# Patient Record
Sex: Male | Born: 1965 | Race: Black or African American | Hispanic: No | Smoking: Current every day smoker
Health system: Southern US, Community
[De-identification: ages and names within clinical notes are randomized; demographics above are authoritative.]

## PROBLEM LIST (undated history)

## (undated) DIAGNOSIS — R42 Dizziness and giddiness: Secondary | ICD-10-CM

---

## 2020-03-12 ENCOUNTER — Other Ambulatory Visit: Payer: Self-pay

## 2020-03-12 ENCOUNTER — Emergency Department (HOSPITAL_COMMUNITY)
Admission: EM | Admit: 2020-03-12 | Discharge: 2020-03-12 | Disposition: A | Discharge status: Home / Self Care | Payer: BC Managed Care – PPO | Attending: Emergency Medicine | Admitting: Emergency Medicine

## 2020-03-12 ENCOUNTER — Emergency Department (HOSPITAL_COMMUNITY): Payer: BC Managed Care – PPO

## 2020-03-12 DIAGNOSIS — R000 Tachycardia, unspecified: Secondary | ICD-10-CM | POA: Diagnosis not present

## 2020-03-12 DIAGNOSIS — R519 Headache, unspecified: Secondary | ICD-10-CM | POA: Diagnosis present

## 2020-03-12 DIAGNOSIS — R0981 Nasal congestion: Secondary | ICD-10-CM

## 2020-03-12 DIAGNOSIS — U071 COVID-19: Secondary | ICD-10-CM | POA: Insufficient documentation

## 2020-03-12 DIAGNOSIS — R197 Diarrhea, unspecified: Secondary | ICD-10-CM | POA: Insufficient documentation

## 2020-03-12 DIAGNOSIS — R109 Unspecified abdominal pain: Secondary | ICD-10-CM | POA: Diagnosis not present

## 2020-03-12 DIAGNOSIS — J3489 Other specified disorders of nose and nasal sinuses: Secondary | ICD-10-CM

## 2020-03-12 DIAGNOSIS — R059 Cough, unspecified: Secondary | ICD-10-CM

## 2020-03-12 LAB — SARS CORONAVIRUS 2 (TAT 6-24 HRS): SARS Coronavirus 2: POSITIVE — AB

## 2020-03-12 MED ORDER — DOXYCYCLINE HYCLATE 100 MG PO CAPS: 100.0000 mg | ORAL_CAPSULE | Freq: Two times a day (BID) | ORAL | 0 refills | Status: DC

## 2020-03-12 NOTE — Discharge Instructions (Signed)
You have been seen here for URI like symptoms.  I recommend taking Tylenol for fever control and ibuprofen for pain control please follow dosing on the back of bottle.  I have started you on antibiotics please take as prescribed.  I recommend staying hydrated and if you do not an appetite, I recommend soups as this will provide you with fluids and calories.  Your Covid test is pending I recommend self quarantine until you get your results back on MyChart.    if you are Covid negative and continue to have symptoms after 1 to 2 weeks you may follow-up with your primary care provider  Come back to the emergency department if you develop chest pain, shortness of breath, severe abdominal pain, uncontrolled nausea, vomiting, diarrhea.

## 2020-03-12 NOTE — ED Triage Notes (Signed)
Starting two weeks ago pt said he thought he had a cold. Tuesday morning he woke up with an itchy throat, he was chilled all day at work, had a headache, chest tightness, runny nose, and fatigue. COVID-19 Moderna vaccinated around 6-7 months ago.

## 2020-03-12 NOTE — ED Provider Notes (Signed)
[facility]-EMERGENCY DEPT Provider Note   CSN: [id number] Arrival date & time: 03/12/20  1042     History Chief Complaint  Patient presents with  . Fatigue    [facility] is a 55 y.o. male.  HPI   Patient with no significant medical history presents to the emergency department with chief complaint of URI-like symptoms.  Patient endorses his symptoms originally started 2 weeks ago but 3 days ago symptoms gotten worse.  He state he has headaches, increased nasal congestion, sinus pressures, scratchy throat, productive cough with rust colored sputum, shortness of breath due to congestion, and some nausea with since resolved.  He endorses that he is vaccine against COVID-19, has not gotten his booster shot, has not gotten the flu shot.  He denies recent sick contacts.  He is not immunocompromise.  He endorses he feels slightly short of breath on exertion but this is due to the nasal congestion and chest tightness from the chest cold.  He has been taking over-the-counter pain medication witrhout relief, is tolerating p.o. He has no history of DVTs or PEs, is not on hormone therapy.  Patient denies alleviating factors.  Patient has chest pain, abdominal pain, nausea, vomiting, diarrhea, general malaise, pedal edema.  No past medical history on file.  There are no problems to display for this patient.        No family history on file.     Home Medications Prior to Admission medications   Medication Sig Start Date End Date Taking? Authorizing Provider  doxycycline (VIBRAMYCIN) 100 MG capsule Take 1 capsule (100 mg total) by mouth 2 (two) times daily. 03/12/20  Yes Carroll Sage, PA-C    Allergies    Patient has no allergy information on record.  Review of Systems   Review of Systems  Constitutional: Negative for chills and fever.  HENT: Positive for congestion and postnasal drip. Negative for sore throat.   Respiratory: Positive for cough and chest  tightness. Negative for shortness of breath.   Cardiovascular: Negative for chest pain.  Gastrointestinal: Negative for abdominal pain, diarrhea, nausea and vomiting.  Genitourinary: Negative for enuresis.  Musculoskeletal: Negative for back pain and myalgias.  Skin: Negative for rash.  Neurological: Positive for headaches. Negative for dizziness.  Hematological: Does not bruise/bleed easily.    Physical Exam Updated Vital Signs BP 115/76   Pulse 80   Temp 98.6 F (37 C) (Oral)   Resp 16   Ht 6\' 7"  (2.007 m)   Wt 83 kg   SpO2 95%   BMI 20.62 kg/m   Physical Exam Vitals and nursing note reviewed.  Constitutional:      General: He is not in acute distress.    Appearance: He is not ill-appearing.  HENT:     Head: Normocephalic and atraumatic.     Right Ear: Tympanic membrane, ear canal and external ear normal.     Left Ear: Tympanic membrane, ear canal and external ear normal.     Nose: No congestion.     Comments: Patient has erythematous turbinates    Mouth/Throat:     Mouth: Mucous membranes are moist.     Pharynx: Oropharynx is clear. No oropharyngeal exudate or posterior oropharyngeal erythema.  Eyes:     Conjunctiva/sclera: Conjunctivae normal.  Cardiovascular:     Rate and Rhythm: Regular rhythm. Tachycardia present.     Pulses: Normal pulses.     Heart sounds: No murmur heard. No friction rub. No gallop.  Pulmonary:     Effort: No respiratory distress.     Breath sounds: Rhonchi and rales present. No wheezing.     Comments: Patient has noted left bibasilar Rales with occasional rhonchi in the mid lung, no signs of respiratory stress noted. Abdominal:     Palpations: Abdomen is soft.     Tenderness: There is no abdominal tenderness.  Musculoskeletal:     Right lower leg: No edema.     Left lower leg: No edema.     Comments: Patient is moving all 4 extremities at difficulty.  Skin:    General: Skin is warm and dry.  Neurological:     Mental Status: He is  alert.  Psychiatric:        Mood and Affect: Mood normal.     ED Results / Procedures / Treatments   Labs (all labs ordered are listed, but only abnormal results are displayed) Labs Reviewed  SARS CORONAVIRUS 2 (TAT 6-24 HRS)    EKG None  Radiology DG Chest Port 1 View  Result Date: 03/12/2020 CLINICAL DATA:  Cough. EXAM: PORTABLE CHEST 1 VIEW COMPARISON:  03/12/2020. FINDINGS: Mediastinum and hilar structures normal. Lungs are clear. No focal pulmonary infiltrate. Tiny bilateral pleural effusions cannot be excluded. No pneumothorax. IMPRESSION: Lungs are clear of infiltrates. Tiny bilateral pleural effusions cannot be excluded. Electronically Signed   By: Maisie Fus  Register   On: 03/12/2020 12:57    Procedures Procedures   Medications Ordered in ED Medications - No data to display  ED Course  I have reviewed the triage vital signs and the nursing notes.  Pertinent labs & imaging results that were available during my care of the patient were reviewed by me and considered in my medical decision making (see chart for details).    MDM Rules/Calculators/A&P                          Initial impression-patient presents with URI-like symptoms.  He is alert, does not appear acute distress, vital signs reassuring.  Concern for pneumonia, sinus infection, systemic infection.  Will obtain chest x-ray and COVID testing for further evaluation.  Work-up-Covid test is pending at this time.  Chest x-ray does not reveal any acute findings.  Rule out- Low suspicion for systemic infection as patient is nontoxic-appearing, vital signs reassuring, no obvious source infection noted on exam.  Low suspicion for pneumonia as lung sounds are clear bilaterally, x-ray did not reveal any acute findings.  I have low suspicion for PE as patient denies pleuritic chest pain, shortness of breath, vital signs reassuring, patient has low risk factors, history more consistent with a viral URI.Marland Kitchen low suspicion for  strep throat as oropharynx was visualized, no erythema or exudates noted.  Low suspicion patient would need  hospitalized due to viral infection or Covid as vital signs reassuring, patient is not in respiratory distress.  Patient does not meet inclusion criteria for infusion as symptoms have been going on for last 3 weeks.   Plan-suspect patient suffering from a viral URI but it is atypical for patient to have continuing symptoms for last 3 weeks, concerning for possible sinusitis/atypical pneumonia.  Will start him on doxy for further treatment.  Have him follow with PCP for further evaluation.  Vital signs have remained stable, no indication for hospital admission.  Patient given at home care as well strict return precautions.  Patient verbalized that they understood agreed to said plan.    Final  Clinical Impression(s) / ED Diagnoses Final diagnoses:  Cough  Nasal congestion  Sinus pressure    Rx / DC Orders ED Discharge Orders         Ordered    doxycycline (VIBRAMYCIN) 100 MG capsule  2 times daily        03/12/20 1318           Barnie Del 03/12/20 1323    Maia Plan, MD 03/13/20 [phone]

## 2020-03-13 ENCOUNTER — Telehealth: Payer: Self-pay

## 2020-03-13 NOTE — Telephone Encounter (Signed)
Patient given positive result and given positive protocols. Patient verbalized understanding

## 2020-06-11 ENCOUNTER — Encounter (HOSPITAL_COMMUNITY): Payer: Self-pay

## 2020-06-11 ENCOUNTER — Emergency Department (HOSPITAL_COMMUNITY): Payer: PRIVATE HEALTH INSURANCE

## 2020-06-11 ENCOUNTER — Other Ambulatory Visit: Payer: Self-pay

## 2020-06-11 ENCOUNTER — Emergency Department (HOSPITAL_COMMUNITY)
Admission: EM | Admit: 2020-06-11 | Discharge: 2020-06-11 | Disposition: A | Discharge status: Home / Self Care | Payer: PRIVATE HEALTH INSURANCE | Attending: Emergency Medicine | Admitting: Emergency Medicine

## 2020-06-11 DIAGNOSIS — Y990 Civilian activity done for income or pay: Secondary | ICD-10-CM | POA: Diagnosis not present

## 2020-06-11 DIAGNOSIS — F17210 Nicotine dependence, cigarettes, uncomplicated: Secondary | ICD-10-CM | POA: Diagnosis not present

## 2020-06-11 DIAGNOSIS — X509XXA Other and unspecified overexertion or strenuous movements or postures, initial encounter: Secondary | ICD-10-CM | POA: Insufficient documentation

## 2020-06-11 DIAGNOSIS — Y9269 Other specified industrial and construction area as the place of occurrence of the external cause: Secondary | ICD-10-CM | POA: Insufficient documentation

## 2020-06-11 DIAGNOSIS — M5417 Radiculopathy, lumbosacral region: Secondary | ICD-10-CM

## 2020-06-11 DIAGNOSIS — M545 Low back pain, unspecified: Secondary | ICD-10-CM | POA: Diagnosis present

## 2020-06-11 MED ORDER — TRAMADOL HCL 50 MG PO TABS: 50.0000 mg | ORAL_TABLET | Freq: Four times a day (QID) | ORAL | 0 refills | Status: DC | PRN

## 2020-06-11 MED ORDER — HYDROCODONE-ACETAMINOPHEN 5-325 MG PO TABS
2.0000 | ORAL_TABLET | Freq: Once | ORAL | Status: DC
  Filled 2020-06-11: qty 2

## 2020-06-11 MED ORDER — CYCLOBENZAPRINE HCL 10 MG PO TABS
5.0000 mg | ORAL_TABLET | Freq: Once | ORAL | Status: AC
  Administered 2020-06-11: 5 mg via ORAL
  Filled 2020-06-11: qty 1

## 2020-06-11 MED ORDER — CYCLOBENZAPRINE HCL 10 MG PO TABS: 5.0000 mg | ORAL_TABLET | Freq: Three times a day (TID) | ORAL | 0 refills | Status: DC | PRN

## 2020-06-11 MED ORDER — TRAMADOL HCL 50 MG PO TABS: 50.0000 mg | ORAL_TABLET | Freq: Once | ORAL | Status: DC

## 2020-06-11 MED ORDER — PREDNISONE 20 MG PO TABS: ORAL_TABLET | ORAL | 0 refills | Status: DC

## 2020-06-11 MED ORDER — PREDNISONE 20 MG PO TABS
60.0000 mg | ORAL_TABLET | Freq: Once | ORAL | Status: AC
  Administered 2020-06-11: 60 mg via ORAL
  Filled 2020-06-11: qty 3

## 2020-06-11 MED ORDER — HYDROCODONE-ACETAMINOPHEN 5-325 MG PO TABS
2.0000 | ORAL_TABLET | Freq: Once | ORAL | Status: AC
  Administered 2020-06-11: 2 via ORAL

## 2020-06-11 NOTE — Discharge Instructions (Signed)
You likely have a pinched nerve in your back from a slipped disc  Please rest for 2 days  Take Motrin 800 mg every 6 hours for pain  Please take prednisone as prescribed  Take tramadol for severe pain and Flexeril for muscle spasm  See neurosurgery for follow-up  Return to ER if you have worse back pain, numbness, weakness, trouble walking, unable to urinate

## 2020-06-11 NOTE — ED Triage Notes (Signed)
Patient c/o left lower back pain 2 days ago. Patient states he does heavy lifting at his job. Patient also reports that the pain radiates into the left leg.

## 2020-06-11 NOTE — ED Provider Notes (Signed)
[facility] Provider Note   CSN: [id number] Arrival date & time: 06/11/20  1218     History Chief Complaint  Patient presents with  . Back Pain    [facility] is a 55 y.o. male hx of here presenting with back pain.  Patient states that he works for [employer].  He states that he lifts heavy items all the time.  He was lifting something heavy 2 days ago and felt a pop in his lower back.  He then had a pain radiating down his left leg.  He has been taking ibuprofen with minimal relief.  Denies any numbness to the leg or trouble urinating.  He states that he was in too much pain so he was not able to work  The history is provided by the patient.       History reviewed. No pertinent past medical history.  There are no problems to display for this patient.   History reviewed. No pertinent surgical history.     Family History  Problem Relation Age of Onset  . Hypertension Mother     Social History   Tobacco Use  . Smoking status: Current Every Day Smoker    Packs/day: 0.25    Types: Cigarettes  . Smokeless tobacco: Never Used  Vaping Use  . Vaping Use: Never used  Substance Use Topics  . Alcohol use: Never  . Drug use: Never    Home Medications Prior to Admission medications   Medication Sig Start Date End Date Taking? Authorizing Provider  doxycycline (VIBRAMYCIN) 100 MG capsule Take 1 capsule (100 mg total) by mouth 2 (two) times daily. 03/12/20   Carroll Sage, PA-C    Allergies    Patient has no known allergies.  Review of Systems   Review of Systems  Musculoskeletal: Positive for back pain.  All other systems reviewed and are negative.   Physical Exam Updated Vital Signs BP 123/82 (BP Location: Left Arm)   Pulse 93   Temp 98 F (36.7 C) (Oral)   Resp 16   Ht 6\' 7"  (2.007 m)   Wt 86.2 kg   SpO2 100%   BMI 21.40 kg/m   Physical Exam Vitals and nursing note reviewed.  Constitutional:       Appearance: Normal appearance.     Comments: Uncomfortable  HENT:     Head: Normocephalic.     Nose: Nose normal.     Mouth/Throat:     Mouth: Mucous membranes are moist.  Eyes:     Extraocular Movements: Extraocular movements intact.     Pupils: Pupils are equal, round, and reactive to light.  Cardiovascular:     Rate and Rhythm: Normal rate and regular rhythm.     Pulses: Normal pulses.     Heart sounds: Normal heart sounds.  Pulmonary:     Effort: Pulmonary effort is normal.     Breath sounds: Normal breath sounds.  Abdominal:     General: Abdomen is flat.     Palpations: Abdomen is soft.  Musculoskeletal:     Cervical back: Normal range of motion and neck supple.     Comments: Left paralumbar tenderness.  No obvious deformity or step-off  Skin:    General: Skin is warm.     Capillary Refill: Capillary refill takes less than 2 seconds.  Neurological:     General: No focal deficit present.     Mental Status: He is alert and oriented to person, place,  and time.  Psychiatric:        Mood and Affect: Mood normal.        Behavior: Behavior normal.     ED Results / Procedures / Treatments   Labs (all labs ordered are listed, but only abnormal results are displayed) Labs Reviewed - No data to display  EKG None  Radiology No results found.  Procedures Procedures   Medications Ordered in ED Medications  cyclobenzaprine (FLEXERIL) tablet 5 mg (has no administration in time range)  predniSONE (DELTASONE) tablet 60 mg (has no administration in time range)  traMADol (ULTRAM) tablet 50 mg (has no administration in time range)    ED Course  I have reviewed the triage vital signs and the nursing notes.  Pertinent labs & imaging results that were available during my care of the patient were reviewed by me and considered in my medical decision making (see chart for details).    MDM Rules/Calculators/A&P                         [facility] is a 55 y.o. male who  presented with back pain.  Likely lumbar radiculopathy from a slip disc.  We will get CT lumbar spine. Patient recovering from narcotics. Will give tramadol and flexeril and steroids.   3:49 PM CT showed multilevel degenerative changes.  There is some disc bulging L3-4 likely causing radiculopathy.  Pain improved and patient is ambulatory.  Will discharge patient home with tramadol and Flexeril and course of steroids and neurosurgery referral.   Final Clinical Impression(s) / ED Diagnoses Final diagnoses:  None    Rx / DC Orders ED Discharge Orders    None       Charlynne Pander, MD 06/11/20 1550

## 2020-06-16 ENCOUNTER — Other Ambulatory Visit: Payer: Self-pay

## 2020-06-16 ENCOUNTER — Emergency Department (HOSPITAL_COMMUNITY)
Admission: EM | Admit: 2020-06-16 | Discharge: 2020-06-16 | Disposition: A | Discharge status: Home / Self Care | Payer: PRIVATE HEALTH INSURANCE | Attending: Emergency Medicine | Admitting: Emergency Medicine

## 2020-06-16 ENCOUNTER — Encounter (HOSPITAL_COMMUNITY): Payer: Self-pay | Admitting: Emergency Medicine

## 2020-06-16 DIAGNOSIS — I10 Essential (primary) hypertension: Secondary | ICD-10-CM | POA: Insufficient documentation

## 2020-06-16 DIAGNOSIS — M5442 Lumbago with sciatica, left side: Secondary | ICD-10-CM | POA: Diagnosis not present

## 2020-06-16 DIAGNOSIS — F17210 Nicotine dependence, cigarettes, uncomplicated: Secondary | ICD-10-CM | POA: Diagnosis not present

## 2020-06-16 DIAGNOSIS — M545 Low back pain, unspecified: Secondary | ICD-10-CM | POA: Diagnosis present

## 2020-06-16 MED ORDER — LIDOCAINE 5 % EX PTCH
1.0000 | MEDICATED_PATCH | CUTANEOUS | Status: DC
  Administered 2020-06-16: 1 via TRANSDERMAL
  Filled 2020-06-16: qty 1

## 2020-06-16 MED ORDER — NAPROXEN 250 MG PO TABS: 500.0000 mg | ORAL_TABLET | Freq: Two times a day (BID) | ORAL | 0 refills | Status: DC

## 2020-06-16 MED ORDER — KETOROLAC TROMETHAMINE 15 MG/ML IJ SOLN
15.0000 mg | Freq: Once | INTRAMUSCULAR | Status: AC
  Administered 2020-06-16: 15 mg via INTRAMUSCULAR
  Filled 2020-06-16: qty 1

## 2020-06-16 MED ORDER — LIDOCAINE 5 % EX PTCH: 1.0000 | MEDICATED_PATCH | CUTANEOUS | 0 refills | Status: DC

## 2020-06-16 MED ORDER — METHOCARBAMOL 500 MG PO TABS: 500.0000 mg | ORAL_TABLET | Freq: Two times a day (BID) | ORAL | 0 refills | Status: DC

## 2020-06-16 NOTE — ED Provider Notes (Signed)
[facility]   CSN: [id number] Arrival date & time: 06/16/20  1215     History Chief Complaint  Patient presents with  . Back Pain    [facility] is a 55 y.o. male patient presented for evaluation of back pain.    Patient states he is having continued back pain, which has been present since he was seen last week.  Pain is not worsened, but is also not excruciating.  He does not feel he can go to work, which is partly what prompted his ER visit.  He was unable to follow-up with neurosurgery as he was confused by the instructions.  He has been taking the medication he was discharged with including prednisone, muscle relaxer, and tramadol without significant improvement symptoms.  He has not tried anything else.  He denies any fall, trauma, or injury.  He denies fevers, chills, nausea, vomiting, loss of bowel bladder control, new numbness or weakness.  Pain radiates to his left leg with certain positions.  Nothing makes his pain better.  Additional history obtained from chart review.  Reviewed recent ER visit which included a CT scan which showed disc degeneration but no acute neurologic emergency  HPI     History reviewed. No pertinent past medical history.  There are no problems to display for this patient.   History reviewed. No pertinent surgical history.     Family History  Problem Relation Age of Onset  . Hypertension Mother     Social History   Tobacco Use  . Smoking status: Current Every Day Smoker    Packs/day: 0.25    Types: Cigarettes  . Smokeless tobacco: Never Used  Vaping Use  . Vaping Use: Never used  Substance Use Topics  . Alcohol use: Never  . Drug use: Never    Home Medications Prior to Admission medications   Medication Sig Start Date End Date Taking? Authorizing Provider  lidocaine (LIDODERM) 5 % Place 1 patch onto the skin daily. Remove & Discard patch within 12 hours or as directed by MD  06/16/20  Yes Laurel Smeltz, PA-C  methocarbamol (ROBAXIN) 500 MG tablet Take 1 tablet (500 mg total) by mouth 2 (two) times daily. 06/16/20  Yes Cedar Ditullio, PA-C  naproxen (NAPROSYN) 250 MG tablet Take 2 tablets (500 mg total) by mouth 2 (two) times daily with a meal. 06/16/20  Yes Khizar Fiorella, PA-C  cyclobenzaprine (FLEXERIL) 10 MG tablet Take 0.5 tablets (5 mg total) by mouth 3 (three) times daily as needed for muscle spasms. 06/11/20   Charlynne Pander, MD  doxycycline (VIBRAMYCIN) 100 MG capsule Take 1 capsule (100 mg total) by mouth 2 (two) times daily. 03/12/20   Carroll Sage, PA-C  predniSONE (DELTASONE) 20 MG tablet Take 60 mg daily x 2 days then 40 mg daily x 2 days then 20 mg daily x 2 days 06/11/20   Charlynne Pander, MD  traMADol (ULTRAM) 50 MG tablet Take 1 tablet (50 mg total) by mouth every 6 (six) hours as needed. 06/11/20   Charlynne Pander, MD    Allergies    Patient has no known allergies.  Review of Systems   Review of Systems  Musculoskeletal: Positive for back pain.  All other systems reviewed and are negative.   Physical Exam Updated Vital Signs BP 118/82   Pulse 89   Temp 98.3 F (36.8 C) (Oral)   Resp 18   SpO2 100%   Physical Exam Vitals  and nursing note reviewed.  Constitutional:      General: He is not in acute distress.    Appearance: He is well-developed.     Comments: Resting in the chair no acute distress  HENT:     Head: Normocephalic and atraumatic.  Eyes:     Conjunctiva/sclera: Conjunctivae normal.     Pupils: Pupils are equal, round, and reactive to light.  Cardiovascular:     Rate and Rhythm: Normal rate and regular rhythm.     Pulses: Normal pulses.  Pulmonary:     Effort: Pulmonary effort is normal. No respiratory distress.     Breath sounds: Normal breath sounds. No wheezing.  Abdominal:     General: There is no distension.     Palpations: Abdomen is soft. There is no mass.     Tenderness: There is no  abdominal tenderness. There is no guarding or rebound.  Musculoskeletal:        General: Tenderness present. Normal range of motion.     Cervical back: Normal range of motion and neck supple.     Comments: Tenderness palpation of bilateral low back musculature, worse on the left side.  No erythema or warmth.  No saddle anesthesia.  Pedal pulses 2+ bilaterally.  Good distal sensation.  Skin:    General: Skin is warm and dry.     Capillary Refill: Capillary refill takes less than 2 seconds.  Neurological:     Mental Status: He is alert and oriented to person, place, and time.     ED Results / Procedures / Treatments   Labs (all labs ordered are listed, but only abnormal results are displayed) Labs Reviewed - No data to display  EKG None  Radiology No results found.  Procedures Procedures   Medications Ordered in ED Medications  lidocaine (LIDODERM) 5 % 1 patch (1 patch Transdermal Patch Applied 06/16/20 1414)  ketorolac (TORADOL) 15 MG/ML injection 15 mg (15 mg Intramuscular Given 06/16/20 1414)    ED Course  I have reviewed the triage vital signs and the nursing notes.  Pertinent labs & imaging results that were available during my care of the patient were reviewed by me and considered in my medical decision making (see chart for details).    MDM Rules/Calculators/A&P                          Patient presenting for evaluation of low back pain.  Physical exam reassuring, neurovascularly intact.  No red flags for back pain.  Pain is reproducible with palpation of the musculature.  Likely musculoskeletal vs sciatic.  Doubt fracture, I do not believe x-rays will be beneficial.  As patient had an overall reassuring CT last week, I do not believe repeat would be helpful.  doubt vertebral injury, infection, spinal cord compression, myelopathy, or cauda equina syndrome.  Will treat symptomatically with NSAIDs, muscle relaxers, muscle creams.  Patient given information to follow-up with  neurosurgery.  At this time, patient appears safe for discharge.  Return precautions given.  Patient states he understands and agrees to plan.  Final Clinical Impression(s) / ED Diagnoses Final diagnoses:  Acute bilateral low back pain with left-sided sciatica    Rx / DC Orders ED Discharge Orders         Ordered    naproxen (NAPROSYN) 250 MG tablet  2 times daily with meals        06/16/20 1617    methocarbamol (ROBAXIN) 500 MG  tablet  2 times daily        06/16/20 1617    lidocaine (LIDODERM) 5 %  Every 24 hours        06/16/20 1617           Matisha Termine, PA-C 06/16/20 1900    Arby Barrette, MD 06/26/20 [phone]

## 2020-06-16 NOTE — Discharge Instructions (Addendum)
It is important that you call the office listed below to set up a follow-up appointment for further evaluation of your back. Take naproxen 2 times a day with meals.  Do not take other anti-inflammatories at the same time (Advil, Motrin, ibuprofen, Aleve). You may supplement with Tylenol if you need further pain control. Use Robaxin as needed for muscle stiffness or soreness. Have caution, as this may make you tired or groggy. Do not drive or operate heavy machinery while taking this medication.  Use lidoderm patch as needed for pain.  Do the back stretches listed in the paperwork to help with your pain. Follow up with the neurosurgeon for further management of your back. Return to the ER if you develop high fevers, numbness, loss of bowel or bladder control, or any new or concerning symptoms.

## 2020-06-16 NOTE — ED Triage Notes (Signed)
Pt still having lower back pains that was seen for here last week when lifted heavy box. Tried going back to work yesterday but unable to do job duties.

## 2020-07-14 ENCOUNTER — Encounter (HOSPITAL_COMMUNITY): Payer: Self-pay | Admitting: *Deleted

## 2020-07-14 ENCOUNTER — Emergency Department (HOSPITAL_COMMUNITY)
Admission: EM | Admit: 2020-07-14 | Discharge: 2020-07-14 | Disposition: A | Discharge status: Home / Self Care | Payer: BC Managed Care – PPO | Attending: Emergency Medicine | Admitting: Emergency Medicine

## 2020-07-14 ENCOUNTER — Other Ambulatory Visit: Payer: Self-pay

## 2020-07-14 ENCOUNTER — Emergency Department (HOSPITAL_COMMUNITY): Payer: BC Managed Care – PPO

## 2020-07-14 DIAGNOSIS — J069 Acute upper respiratory infection, unspecified: Secondary | ICD-10-CM | POA: Diagnosis not present

## 2020-07-14 DIAGNOSIS — F17210 Nicotine dependence, cigarettes, uncomplicated: Secondary | ICD-10-CM | POA: Diagnosis not present

## 2020-07-14 DIAGNOSIS — Z20822 Contact with and (suspected) exposure to covid-19: Secondary | ICD-10-CM | POA: Insufficient documentation

## 2020-07-14 DIAGNOSIS — R059 Cough, unspecified: Secondary | ICD-10-CM | POA: Diagnosis present

## 2020-07-14 DIAGNOSIS — R519 Headache, unspecified: Secondary | ICD-10-CM

## 2020-07-14 LAB — RESP PANEL BY RT-PCR (FLU A&B, COVID) ARPGX2
Influenza A by PCR: NEGATIVE
Influenza B by PCR: NEGATIVE
SARS Coronavirus 2 by RT PCR: NEGATIVE

## 2020-07-14 MED ORDER — AZITHROMYCIN 250 MG PO TABS: 250.0000 mg | ORAL_TABLET | Freq: Every day | ORAL | 0 refills | Status: DC

## 2020-07-14 NOTE — ED Provider Notes (Signed)
[facility]-EMERGENCY DEPT Provider Note   CSN: [id number] Arrival date & time: 07/14/20  1241     History Chief Complaint  Patient presents with  . Nasal Congestion  . Headache    Kevin Knapp is a 55 y.o. male with no reported medical history.  Patient presents with a chief complaint of cough, nasal congestion, rhinorrhea, and headache.  Patient reports that his nasal congestion and rhinorrhea started on Friday 6/3.  He reports that his cough and headache started on Sunday.  He reports that his cough is nonproductive.  Nasal congestion is described as yellow and thick.  Patient reports that his headaches have been intermittent.  Patient has pain to the entire left side of his head.  Patient rates pain 7/10 on the pain scale.  Patient reports that headache onset is gradual and gradually worsens over time.  Patient reports that headache is worse when watching TV or driving.  Patient reports improvement in symptoms when taking naproxen.  Reports that headache pain is similar to previous headaches he has had with a sinus infection.  Endorses chills and sinus pressure.  Patient reports that several coworkers of recently tested positive for COVID-19.  Patient reports that he was vaccinated for COVID-19 and however has not received booster or influenza vaccination.  Patient denies any fevers, sore throat, chest pain, shortness of breath, weakness, numbness, facial asymmetry, slurred speech, syncope.  Has any recent falls or injuries.  HPI     History reviewed. No pertinent past medical history.  There are no problems to display for this patient.   History reviewed. No pertinent surgical history.     Family History  Problem Relation Age of Onset  . Hypertension Mother     Social History   Tobacco Use  . Smoking status: Current Every Day Smoker    Packs/day: 0.25    Types: Cigarettes  . Smokeless tobacco: Never Used  Vaping Use  . Vaping Use: Never used   Substance Use Topics  . Alcohol use: Never  . Drug use: Never    Home Medications Prior to Admission medications   Medication Sig Start Date End Date Taking? Authorizing Provider  cyclobenzaprine (FLEXERIL) 10 MG tablet Take 0.5 tablets (5 mg total) by mouth 3 (three) times daily as needed for muscle spasms. 06/11/20   Charlynne Pander, MD  doxycycline (VIBRAMYCIN) 100 MG capsule Take 1 capsule (100 mg total) by mouth 2 (two) times daily. 03/12/20   Carroll Sage, PA-C  lidocaine (LIDODERM) 5 % Place 1 patch onto the skin daily. Remove & Discard patch within 12 hours or as directed by MD 06/16/20   Caccavale, Sophia, PA-C  methocarbamol (ROBAXIN) 500 MG tablet Take 1 tablet (500 mg total) by mouth 2 (two) times daily. 06/16/20   Caccavale, Sophia, PA-C  naproxen (NAPROSYN) 250 MG tablet Take 2 tablets (500 mg total) by mouth 2 (two) times daily with a meal. 06/16/20   Caccavale, Sophia, PA-C  predniSONE (DELTASONE) 20 MG tablet Take 60 mg daily x 2 days then 40 mg daily x 2 days then 20 mg daily x 2 days 06/11/20   Charlynne Pander, MD  traMADol (ULTRAM) 50 MG tablet Take 1 tablet (50 mg total) by mouth every 6 (six) hours as needed. 06/11/20   Charlynne Pander, MD    Allergies    Patient has no known allergies.  Review of Systems   Review of Systems  Constitutional: Positive for chills. Negative for  fever.  HENT: Positive for congestion, rhinorrhea, sinus pressure and sneezing. Negative for facial swelling and sore throat.   Eyes: Negative for visual disturbance.  Respiratory: Positive for cough. Negative for shortness of breath.   Cardiovascular: Negative for chest pain.  Gastrointestinal: Negative for abdominal pain, diarrhea, nausea and vomiting.  Genitourinary: Negative for difficulty urinating.  Musculoskeletal: Negative for back pain, neck pain and neck stiffness.  Skin: Negative for color change and rash.  Neurological: Positive for headaches. Negative for dizziness,  tremors, seizures, syncope, facial asymmetry, speech difficulty, weakness, light-headedness and numbness.  Psychiatric/Behavioral: Negative for confusion.    Physical Exam Updated Vital Signs BP 122/82   Pulse 66   Temp 98.4 F (36.9 C) (Oral)   Resp 16   Ht 6\' 7"  (2.007 m)   Wt 83.9 kg   SpO2 100%   BMI 20.84 kg/m   Physical Exam Vitals and nursing note reviewed.  Constitutional:      General: He is not in acute distress.    Appearance: He is not ill-appearing, toxic-appearing or diaphoretic.  HENT:     Nose:     Right Sinus: No maxillary sinus tenderness or frontal sinus tenderness.     Left Sinus: No maxillary sinus tenderness or frontal sinus tenderness.     Mouth/Throat:     Mouth: Mucous membranes are moist.     Pharynx: Oropharynx is clear. Uvula midline. No pharyngeal swelling, oropharyngeal exudate, posterior oropharyngeal erythema or uvula swelling.     Tonsils: No tonsillar exudate or tonsillar abscesses. 1+ on the right. 1+ on the left.  Eyes:     General: No scleral icterus.       Right eye: No discharge.        Left eye: No discharge.     Extraocular Movements: Extraocular movements intact.     Pupils: Pupils are equal, round, and reactive to light.  Cardiovascular:     Rate and Rhythm: Normal rate.  Pulmonary:     Effort: Pulmonary effort is normal. No tachypnea, bradypnea or respiratory distress.     Breath sounds: Normal breath sounds. No stridor.     Comments: Patient able to speak in full complete sentences without difficulty Abdominal:     General: Abdomen is flat. There is no distension. There are no signs of injury.     Palpations: Abdomen is soft. There is no mass or pulsatile mass.     Tenderness: There is no abdominal tenderness. There is no guarding or rebound.  Musculoskeletal:     Cervical back: Normal range of motion and neck supple. No rigidity.     Right lower leg: No swelling or tenderness. No edema.     Left lower leg: No swelling or  tenderness. No edema.  Skin:    General: Skin is warm and dry.  Neurological:     General: No focal deficit present.     Mental Status: He is alert and oriented to person, place, and time.     GCS: GCS eye subscore is 4. GCS verbal subscore is 5. GCS motor subscore is 6.     Cranial Nerves: No cranial nerve deficit or facial asymmetry.     Sensory: Sensation is intact.     Motor: No weakness, tremor, seizure activity or pronator drift.     Coordination: Romberg sign negative. Finger-Nose-Finger Test normal.     Gait: Gait is intact. Gait normal.     Comments: CN II-XII intact, equal grip strength, +5 strength to  bilateral upper and lower extremities, sensation to light touch intact bilaterally  Psychiatric:        Behavior: Behavior is cooperative.     ED Results / Procedures / Treatments   Labs (all labs ordered are listed, but only abnormal results are displayed) Labs Reviewed  RESP PANEL BY RT-PCR (FLU A&B, COVID) ARPGX2    EKG None  Radiology DG Chest 2 View  Result Date: 07/14/2020 CLINICAL DATA:  Sinus congestion, cough and shortness breath. Possible COVID exposure. Smoker. EXAM: CHEST - 2 VIEW COMPARISON:  03/12/2020 FINDINGS: Normal sized heart. Interval minimal patchy opacity at right lateral lung base and questionable minimal patchy opacity at the medial right lung base and left lateral lung base. No pleural fluid. The lungs are hyperexpanded. Unremarkable bones. IMPRESSION: 1. Possible minimal pneumonia or COVID pneumonitis at the lung bases greater on the right. 2. COPD. Electronically Signed   By: Beckie SaltsSteven  Reid M.D.   On: 07/14/2020 13:42    Procedures Procedures   Medications Ordered in ED Medications - No data to display  ED Course  I have reviewed the triage vital signs and the nursing notes.  Pertinent labs & imaging results that were available during my care of the patient were reviewed by me and considered in my medical decision making (see chart for  details).    MDM Rules/Calculators/A&P                          Alert 55 year old male no acute distress, nontoxic-appearing.  Patient presents with a chief complaint of cough, nasal congestion, rhinorrhea, and headache.  Patient has no focal neurological deficit on exam.  Headache was gradual in onset and has progressively worsened over time.  Pain is not worse with exertion.  Low suspicion for subarachnoid hemorrhage at this time.  Shared decision-making with patient about obtaining noncontrast head CT to look for intracranial mass or abnormality causing his headache.  Defers treatment at this time stating that he thinks it is more likely due to sinus pressure or possible sinus infection.  Discussed that without noncontrast head CT we would not be able to rule out any intracranial cause of his symptoms.  Patient expressed understanding of this.  Given strict return precautions.  Patient expressed understanding of all instructions.  Patient able to speak in full complete sentences without difficulty.  Lungs clear to auscultation bilaterally.  X-ray imaging obtained shows possible minimal pneumonia or COVID pneumonitis at the lung bases greater on the right.  Low suspicion for pneumonia at this time as patient is afebrile and has no adventitious lung sounds on exam.  We will take watchful waiting approach to treatment for possible pneumonia at this time.  Patient was given prescription for azithromycin and advised to start this medication if his symptoms did not improve in 2 to 3 days.    Respiratory panel negative for COVID-19 and influenza.  Patient hemodynamically stable at this time.  Will discharge patient.  Patient advised to follow-up with primary care provider or urgent care if his symptoms do not improve.  Patient given strict return cautions.  Patient expressed understanding of all instructions and is agreeable with this plan.  Kevin Riggsndre Juan Knaus was evaluated in Emergency Department on  07/15/2020 for the symptoms described in the history of present illness. He was evaluated in the context of the global COVID-19 pandemic, which necessitated consideration that the patient might be at risk for infection with the SARS-CoV-2 virus that  causes COVID-19. Institutional protocols and algorithms that pertain to the evaluation of patients at risk for COVID-19 are in a state of rapid change based on information released by regulatory bodies including the CDC and federal and state organizations. These policies and algorithms were followed during the patient's care in the ED.   Final Clinical Impression(s) / ED Diagnoses Final diagnoses:  Upper respiratory tract infection, unspecified type  Acute nonintractable headache, unspecified headache type    Rx / DC Orders ED Discharge Orders         Ordered    azithromycin (ZITHROMAX) 250 MG tablet  Daily        07/14/20 1746           Haskel Schroeder, PA-C 07/15/20 0349    Mancel Bale, MD 07/15/20 1130

## 2020-07-14 NOTE — ED Triage Notes (Signed)
Pt complains of facial pain, congestion, headache, burning in nostrils. He reports COVID cases at work. No cough.

## 2020-07-14 NOTE — Discharge Instructions (Addendum)
You came to the emergency department today to be evaluated for your cough, nasal congestion, runny nose, and headache.  Your COVID-19 and influenza testing were negative.  Your chest x-ray showed the early stages of a possible pneumonia.  Due to this I have given you prescription for azithromycin.  Please wait 2 to 3 days before starting this medication.  Only start this medication if you have worsening of your symptoms. Use saline nasal spray for congestion.  Please take Ibuprofen (Advil, motrin) and Tylenol (acetaminophen) to relieve your pain.    You may take up to 600 MG (3 pills) of normal strength ibuprofen every 8 hours as needed.   You make take tylenol, up to 1,000 mg (two extra strength pills) every 8 hours as needed.   It is safe to take ibuprofen and tylenol at the same time as they work differently.   Do not take more than 3,000 mg tylenol in a 24 hour period (not more than one dose every 8 hours.  Please check all medication labels as many medications such as pain and cold medications may contain tylenol.  Do not drink alcohol while taking these medications.  Do not take other NSAID'S while taking ibuprofen (such as aleve or naproxen).  Please take ibuprofen with food to decrease stomach upset.   If your symptoms do not improve please follow-up with your primary care provider or urgent care.  Return to the ER for significant shortness of breath, uncontrollable vomiting, severe chest pain, inability to tolerate fluids, changes in mental status such as confusion or other concerning symptoms.  Get help right away if: Your headache becomes severe quickly. Your headache gets worse after moderate to intense physical activity. You have repeated vomiting. You have a stiff neck. You have a loss of vision. You have problems with speech. You have pain in the eye or ear. You have muscular weakness or loss of muscle control. You lose your balance or have trouble walking. You feel faint or  pass out. You have confusion. You have a seizure.

## 2020-07-14 NOTE — ED Provider Notes (Signed)
Emergency Medicine Provider Triage Evaluation Note  Kevin Knapp , a 55 y.o. male  was evaluated in triage.  Pt complains of sinus congestion, cough, shortness of breath.  Patient reports multiple coworkers at work he had COVID, he has had a prior infection about 6 months ago and has been vaccinated but has not had booster.  Reports he has similar symptoms with previous COVID infection.  No fever.  Review of Systems  Positive: Congestion, cough, shortness of breath Negative: Fever  Physical Exam  BP 112/74 (BP Location: Left Arm)   Pulse 84   Temp 98.4 F (36.9 C) (Oral)   Resp 16   Ht 6\' 7"  (2.007 m)   Wt 83.9 kg   SpO2 100%   BMI 20.84 kg/m  Gen:   Awake, no distress   Resp:  Normal effort  MSK:   Moves extremities without difficulty  Other:  Nasal congestion noted bilaterally with some sinus pain  Medical Decision Making  Medically screening exam initiated at 1:10 PM.  Appropriate orders placed.  [facility] was informed that the remainder of the evaluation will be completed by another provider, this initial triage assessment does not replace that evaluation, and the importance of remaining in the ED until their evaluation is complete.     [facility], PA-C 07/14/20 1314    09/13/20, MD 07/14/20 1546

## 2020-08-27 ENCOUNTER — Encounter: Payer: Self-pay | Admitting: Emergency Medicine

## 2020-08-27 ENCOUNTER — Ambulatory Visit
Admission: EM | Admit: 2020-08-27 | Discharge: 2020-08-27 | Disposition: A | Discharge status: Home / Self Care | Payer: BC Managed Care – PPO | Attending: Emergency Medicine | Admitting: Emergency Medicine

## 2020-08-27 ENCOUNTER — Other Ambulatory Visit: Payer: Self-pay

## 2020-08-27 DIAGNOSIS — Z20822 Contact with and (suspected) exposure to covid-19: Secondary | ICD-10-CM

## 2020-08-27 DIAGNOSIS — J069 Acute upper respiratory infection, unspecified: Secondary | ICD-10-CM

## 2020-08-27 DIAGNOSIS — Z1152 Encounter for screening for COVID-19: Secondary | ICD-10-CM

## 2020-08-27 MED ORDER — FLUTICASONE PROPIONATE 50 MCG/ACT NA SUSP: 1.0000 | Freq: Every day | NASAL | 0 refills | Status: DC

## 2020-08-27 MED ORDER — IBUPROFEN 800 MG PO TABS: 800.0000 mg | ORAL_TABLET | Freq: Three times a day (TID) | ORAL | 0 refills | Status: DC

## 2020-08-27 MED ORDER — DM-GUAIFENESIN ER 30-600 MG PO TB12: 1.0000 | ORAL_TABLET | Freq: Two times a day (BID) | ORAL | 0 refills | Status: DC

## 2020-08-27 MED ORDER — AMOXICILLIN-POT CLAVULANATE 875-125 MG PO TABS: 1.0000 | ORAL_TABLET | Freq: Two times a day (BID) | ORAL | 0 refills | Status: AC

## 2020-08-27 NOTE — ED Triage Notes (Signed)
Pt here for nasal congestion and left sided facial pain x 3 days; pt sts some fatigue

## 2020-08-27 NOTE — Discharge Instructions (Addendum)
COVID test pending, monitor MyChart for results Rest and fluids Tylenol and ibuprofen to help with headache/sinus pressure Flonase nasal spray 1 to 2 spray in each nostril daily Mucinex DM twice daily to further help with congestion, cough May fill prescription for Augmentin on Monday if not seeing any improvement with the above over the next 3 to 4 days and COVID test negative Please return if not improving or worsening

## 2020-08-27 NOTE — ED Provider Notes (Signed)
[facility]    CSN: [id number] Arrival date & time: 08/27/20  1036      History   Chief Complaint Chief Complaint  Patient presents with   Nasal Congestion   Fatigue    HPI Kevin Knapp is a 55 y.o. male presenting today for evaluation of URI symptoms.  Reports that he has had nasal congestion with associated left-sided pain x3 days.  Associated fatigue.  Denies close sick contacts.  Denies significant cough chest pain or shortness of breath  HPI  History reviewed. No pertinent past medical history.  There are no problems to display for this patient.   History reviewed. No pertinent surgical history.     Home Medications    Prior to Admission medications   Medication Sig Start Date End Date Taking? Authorizing Provider  amoxicillin-clavulanate (AUGMENTIN) 875-125 MG tablet Take 1 tablet by mouth every 12 (twelve) hours for 7 days. 08/30/20 09/06/20 Yes Xzandria Clevinger C, PA-C  dextromethorphan-guaiFENesin (MUCINEX DM) 30-600 MG 12hr tablet Take 1 tablet by mouth 2 (two) times daily. 08/27/20  Yes Sheyann Sulton C, PA-C  fluticasone (FLONASE) 50 MCG/ACT nasal spray Place 1-2 sprays into both nostrils daily. 08/27/20  Yes Hadrian Yarbrough C, PA-C  ibuprofen (ADVIL) 800 MG tablet Take 1 tablet (800 mg total) by mouth 3 (three) times daily. 08/27/20  Yes Tiffanyann Deroo C, PA-C  lidocaine (LIDODERM) 5 % Place 1 patch onto the skin daily. Remove & Discard patch within 12 hours or as directed by MD 06/16/20   Caccavale, Sophia, PA-C    Family History Family History  Problem Relation Age of Onset   Hypertension Mother     Social History Social History   Tobacco Use   Smoking status: Every Day    Packs/day: 0.25    Types: Cigarettes   Smokeless tobacco: Never  Vaping Use   Vaping Use: Never used  Substance Use Topics   Alcohol use: Never   Drug use: Never     Allergies   Patient has no known allergies.   Review of Systems Review of Systems   Constitutional:  Positive for fatigue. Negative for activity change, appetite change, chills and fever.  HENT:  Positive for congestion, rhinorrhea and sinus pressure. Negative for ear pain, sore throat and trouble swallowing.   Eyes:  Negative for discharge and redness.  Respiratory:  Negative for cough, chest tightness and shortness of breath.   Cardiovascular:  Negative for chest pain.  Gastrointestinal:  Negative for abdominal pain, diarrhea, nausea and vomiting.  Musculoskeletal:  Negative for myalgias.  Skin:  Negative for rash.  Neurological:  Negative for dizziness, light-headedness and headaches.    Physical Exam Triage Vital Signs ED Triage Vitals  Enc Vitals Group     BP 08/27/20 1046 106/74     Pulse Rate 08/27/20 1046 94     Resp 08/27/20 1046 18     Temp 08/27/20 1046 98.8 F (37.1 C)     Temp Source 08/27/20 1046 Oral     SpO2 08/27/20 1046 97 %     Weight --      Height --      Head Circumference --      Peak Flow --      Pain Score 08/27/20 1047 4     Pain Loc --      Pain Edu? --      Excl. in GC? --    No data found.  Updated Vital Signs BP 106/74 (BP  Location: Right Arm)   Pulse 94   Temp 98.8 F (37.1 C) (Oral)   Resp 18   SpO2 97%   Visual Acuity Right Eye Distance:   Left Eye Distance:   Bilateral Distance:    Right Eye Near:   Left Eye Near:    Bilateral Near:     Physical Exam Vitals and nursing note reviewed.  Constitutional:      Appearance: He is well-developed.     Comments: No acute distress  HENT:     Head: Normocephalic and atraumatic.     Ears:     Comments: Bilateral ears without tenderness to palpation of external auricle, tragus and mastoid, EAC's without erythema or swelling, TM's with good bony landmarks and cone of light. Non erythematous.      Nose: Nose normal.     Mouth/Throat:     Comments: Oral mucosa pink and moist, no tonsillar enlargement or exudate. Posterior pharynx patent and nonerythematous, no uvula  deviation or swelling. Normal phonation.  Eyes:     Conjunctiva/sclera: Conjunctivae normal.  Cardiovascular:     Rate and Rhythm: Normal rate.  Pulmonary:     Effort: Pulmonary effort is normal. No respiratory distress.     Comments: Breathing comfortably at rest, CTABL, no wheezing, rales or other adventitious sounds auscultated  Abdominal:     General: There is no distension.  Musculoskeletal:        General: Normal range of motion.     Cervical back: Neck supple.  Skin:    General: Skin is warm and dry.  Neurological:     Mental Status: He is alert and oriented to person, place, and time.     UC Treatments / Results  Labs (all labs ordered are listed, but only abnormal results are displayed) Labs Reviewed  NOVEL CORONAVIRUS, NAA    EKG   Radiology No results found.  Procedures Procedures (including critical care time)  Medications Ordered in UC Medications - No data to display  Initial Impression / Assessment and Plan / UC Course  I have reviewed the triage vital signs and the nursing notes.  Pertinent labs & imaging results that were available during my care of the patient were reviewed by me and considered in my medical decision making (see chart for details).     Viral URI/sinusitis-recommending continued symptomatic and supportive care rest and fluids, COVID test pending Provided prescription for Augmentin to fill on Monday if not having any improvement with recommendations given today and COVID test negative to treat sinusitis given unilateral sinus pressure.  Discussed strict return precautions. Patient verbalized understanding and is agreeable with plan.  Final Clinical Impressions(s) / UC Diagnoses   Final diagnoses:  Encounter for screening laboratory testing for COVID-19 virus  Viral URI with cough     Discharge Instructions      COVID test pending, monitor MyChart for results Rest and fluids Tylenol and ibuprofen to help with  headache/sinus pressure Flonase nasal spray 1 to 2 spray in each nostril daily Mucinex DM twice daily to further help with congestion, cough May fill prescription for Augmentin on Monday if not seeing any improvement with the above over the next 3 to 4 days and COVID test negative Please return if not improving or worsening     ED Prescriptions     Medication Sig Dispense Auth. Provider   fluticasone (FLONASE) 50 MCG/ACT nasal spray Place 1-2 sprays into both nostrils daily. 16 g Saman Giddens, [place] C, [place]  ibuprofen (ADVIL) 800 MG tablet Take 1 tablet (800 mg total) by mouth 3 (three) times daily. 21 tablet Irvine Glorioso C, PA-C   dextromethorphan-guaiFENesin (MUCINEX DM) 30-600 MG 12hr tablet Take 1 tablet by mouth 2 (two) times daily. 14 tablet Antjuan Rothe C, PA-C   amoxicillin-clavulanate (AUGMENTIN) 875-125 MG tablet Take 1 tablet by mouth every 12 (twelve) hours for 7 days. 14 tablet Gypsy Kellogg, [place] C, PA-C      PDMP not reviewed this encounter.   Lew Dawes, PA-C 08/27/20 1421

## 2020-08-29 LAB — NOVEL CORONAVIRUS, NAA: SARS-CoV-2, NAA: NOT DETECTED

## 2020-08-29 LAB — SARS-COV-2, NAA 2 DAY TAT

## 2020-10-16 ENCOUNTER — Ambulatory Visit
Admission: EM | Admit: 2020-10-16 | Discharge: 2020-10-16 | Disposition: A | Discharge status: Home / Self Care | Payer: BC Managed Care – PPO | Attending: Family Medicine | Admitting: Family Medicine

## 2020-10-16 ENCOUNTER — Other Ambulatory Visit: Payer: Self-pay

## 2020-10-16 DIAGNOSIS — B349 Viral infection, unspecified: Secondary | ICD-10-CM

## 2020-10-16 DIAGNOSIS — Z20822 Contact with and (suspected) exposure to covid-19: Secondary | ICD-10-CM | POA: Diagnosis not present

## 2020-10-16 DIAGNOSIS — R739 Hyperglycemia, unspecified: Secondary | ICD-10-CM

## 2020-10-16 DIAGNOSIS — R0602 Shortness of breath: Secondary | ICD-10-CM

## 2020-10-16 LAB — POCT FASTING CBG KUC MANUAL ENTRY: POCT Glucose (KUC): 166 mg/dL — AB (ref 70–99)

## 2020-10-16 MED ORDER — PROMETHAZINE-DM 6.25-15 MG/5ML PO SYRP: 5.0000 mL | ORAL_SOLUTION | Freq: Four times a day (QID) | ORAL | 0 refills | Status: DC | PRN

## 2020-10-16 MED ORDER — ALBUTEROL SULFATE HFA 108 (90 BASE) MCG/ACT IN AERS
2.0000 | INHALATION_SPRAY | Freq: Once | RESPIRATORY_TRACT | Status: AC
  Administered 2020-10-16: 2 via RESPIRATORY_TRACT

## 2020-10-16 NOTE — ED Triage Notes (Signed)
Patient presents to Urgent Care with complaints of fever, SOB, fatigue and dizziness since Tuesday. Treating symptoms with ibuprofen.

## 2020-10-16 NOTE — ED Provider Notes (Signed)
[facility]    CSN: [id number] Arrival date & time: 10/16/20  0825      History   Chief Complaint Chief Complaint  Patient presents with   Fatigue   Dizziness    HPI Kevin Knapp is a 55 y.o. male.   HPI Patient presents today with fatigue, dizziness, cough, shortness of breath with activity, and generalized body aches x3 days.  Patient denies any known exposure to anyone positive for COVID.  He has been taking ibuprofen for body aches and has not attempted relief with any other medications. Patient's blood glucose is elevated on arrival here today.  Patient has no known history of diabetes.  Endorses a family history of diabetes.  He is in need of a primary care provider. History reviewed. No pertinent past medical history.  There are no problems to display for this patient.   History reviewed. No pertinent surgical history.     Home Medications    Prior to Admission medications   Medication Sig Start Date End Date Taking? Authorizing Provider  dextromethorphan-guaiFENesin (MUCINEX DM) 30-600 MG 12hr tablet Take 1 tablet by mouth 2 (two) times daily. 08/27/20   Wieters, Hallie C, PA-C  fluticasone (FLONASE) 50 MCG/ACT nasal spray Place 1-2 sprays into both nostrils daily. 08/27/20   Wieters, Hallie C, PA-C  ibuprofen (ADVIL) 800 MG tablet Take 1 tablet (800 mg total) by mouth 3 (three) times daily. 08/27/20   Wieters, Hallie C, PA-C  lidocaine (LIDODERM) 5 % Place 1 patch onto the skin daily. Remove & Discard patch within 12 hours or as directed by MD 06/16/20   Caccavale, Sophia, PA-C    Family History Family History  Problem Relation Age of Onset   Hypertension Mother     Social History Social History   Tobacco Use   Smoking status: Every Day    Packs/day: 0.25    Types: Cigarettes   Smokeless tobacco: Never  Vaping Use   Vaping Use: Never used  Substance Use Topics   Alcohol use: Never   Drug use: Never     Allergies   Patient has no known  allergies.   Review of Systems Review of Systems Pertinent negatives listed in HPI   Physical Exam Triage Vital Signs ED Triage Vitals  Enc Vitals Group     BP 10/16/20 0837 139/64     Pulse Rate 10/16/20 0837 (!) 102     Resp 10/16/20 0837 16     Temp 10/16/20 0837 98.7 F (37.1 C)     Temp Source 10/16/20 0837 Oral     SpO2 10/16/20 0837 98 %     Weight --      Height --      Head Circumference --      Peak Flow --      Pain Score 10/16/20 0840 7     Pain Loc --      Pain Edu? --      Excl. in GC? --    No data found.  Updated Vital Signs BP 139/64 (BP Location: Left Arm)   Pulse (!) 102   Temp 98.7 F (37.1 C) (Oral)   Resp 16   SpO2 98%   Visual Acuity Right Eye Distance:   Left Eye Distance:   Bilateral Distance:    Right Eye Near:   Left Eye Near:    Bilateral Near:     Physical Exam Constitutional:      Appearance: Normal appearance.  HENT:  Nose: Nose normal.  Eyes:     Extraocular Movements: Extraocular movements intact.     Conjunctiva/sclera: Conjunctivae normal.     Pupils: Pupils are equal, round, and reactive to light.  Cardiovascular:     Rate and Rhythm: Normal rate and regular rhythm.  Pulmonary:     Effort: Pulmonary effort is normal.     Breath sounds: Normal breath sounds.  Neurological:     General: No focal deficit present.     Mental Status: He is alert.  Psychiatric:        Mood and Affect: Mood normal.        Behavior: Behavior normal.        Thought Content: Thought content normal.        Judgment: Judgment normal.     UC Treatments / Results  Labs (all labs ordered are listed, but only abnormal results are displayed) Labs Reviewed  COVID-19, FLU A+B NAA  POCT FASTING [facility]G [facility] MANUAL ENTRY    EKG   Radiology No results found.  Procedures Procedures (including critical care time)  Medications Ordered in UC Medications - No data to display  Initial Impression / Assessment and Plan / UC Course  I  have reviewed the triage vital signs and the nursing notes.  Pertinent labs & imaging results that were available during my care of the patient were reviewed by me and considered in my medical decision making (see chart for details).     Elevated blood sugar per plan of care.  Patient advised to establish with a primary care provider as this warrants further work-up and evaluation to rule out any underlying diabetes.  Information given to follow-up at the [facility] [place]. COVID/Flu test pending. Symptom management warranted only.  Manage fever with Tylenol and ibuprofen.  Nasal symptoms with over-the-counter antihistamines recommended.  Treatment per discharge medications/discharge instructions.  Red flags/ER precautions given. The most current CDC isolation/quarantine recommendation advised.     Final Clinical Impressions(s) / UC Diagnoses   Final diagnoses:  Encounter for screening laboratory testing for COVID-19 virus  Shortness of breath  Elevated blood sugar     Discharge Instructions       Your blood sugar was slightly elevated during your visit here today which could be the source of dizziness would recommend further work-up and evaluation with a complete physical by a primary care provider.  I have included contact information for you to get established with a primary care doctor to schedule a complete physical exam.  Your COVID 19/ Flu results should result within 2-4 days. Negative results are immediately resulted to Mychart. Positive results will receive a follow-up call from our clinic. If symptoms are present, I recommend home quarantine until results are known.  Alternate Tylenol and ibuprofen as needed for body aches and fever.  Symptom management per recommendations discussed today.  If any breathing difficulty or chest pain develops go immediately to the closest emergency department for evaluation.    ED Prescriptions     Medication Sig Dispense Auth. Provider    promethazine-dextromethorphan (PROMETHAZINE-DM) 6.25-15 MG/5ML syrup Take 5 mLs by mouth 4 (four) times daily as needed for cough. 140 mL Bing Neighbors, FNP      PDMP not reviewed this encounter.   Bing Neighbors, [place] 10/16/20 [phone]

## 2020-10-16 NOTE — Discharge Instructions (Signed)
  Your blood sugar was slightly elevated during your visit here today which could be the source of dizziness would recommend further work-up and evaluation with a complete physical by a primary care provider.  I have included contact information for you to get established with a primary care doctor to schedule a complete physical exam.  Your COVID 19/ Flu results should result within 2-4 days. Negative results are immediately resulted to Mychart. Positive results will receive a follow-up call from our clinic. If symptoms are present, I recommend home quarantine until results are known.  Alternate Tylenol and ibuprofen as needed for body aches and fever.  Symptom management per recommendations discussed today.  If any breathing difficulty or chest pain develops go immediately to the closest emergency department for evaluation.

## 2020-10-17 LAB — NOVEL CORONAVIRUS, NAA (HOSP ORDER, SEND-OUT TO REF LAB; TAT 18-24 HRS): SARS-CoV-2, NAA: NOT DETECTED

## 2021-01-19 ENCOUNTER — Ambulatory Visit
Admission: EM | Admit: 2021-01-19 | Discharge: 2021-01-19 | Disposition: A | Discharge status: Home / Self Care | Payer: BC Managed Care – PPO | Attending: Emergency Medicine | Admitting: Emergency Medicine

## 2021-01-19 ENCOUNTER — Other Ambulatory Visit: Payer: Self-pay

## 2021-01-19 DIAGNOSIS — J988 Other specified respiratory disorders: Secondary | ICD-10-CM

## 2021-01-19 DIAGNOSIS — B9789 Other viral agents as the cause of diseases classified elsewhere: Secondary | ICD-10-CM

## 2021-01-19 DIAGNOSIS — J189 Pneumonia, unspecified organism: Secondary | ICD-10-CM

## 2021-01-19 MED ORDER — AZITHROMYCIN 250 MG PO TABS: 500.0000 mg | ORAL_TABLET | Freq: Every day | ORAL | 0 refills | Status: AC

## 2021-01-19 MED ORDER — AMOXICILLIN-POT CLAVULANATE 875-125 MG PO TABS: 1.0000 | ORAL_TABLET | Freq: Two times a day (BID) | ORAL | 0 refills | Status: AC

## 2021-01-19 NOTE — ED Triage Notes (Signed)
Pt c/o headache, facial pressure, abd pain (last night), chills, and congestion.

## 2021-01-19 NOTE — ED Provider Notes (Signed)
[facility]    CSN: [id number] Arrival date & time: 01/19/21  1227    HISTORY  No chief complaint on file.  HPI Kevin Knapp is a 55 y.o. male. Pt c/o headache, facial pressure, abd pain (last night), chills, nonproductive cough and congestion, patient states he is also been very fatigued.  Patient states his symptoms began 5 days ago.  Patient reports many of his coworkers have been out sick recently.  Patient denies any specific sick contacts.  Patient denies history of allergies and asthma.  Patient denies history of immunosuppression.  Patient states he is had some nausea, but has not had any vomiting or diarrhea.  Patient states he is also had a lot of body ache.  After physical exam today, patient added that he has noticed that when he coughs, he has had some back pain on the right side of his back, states he thinks he may have coughed so hard he is pulled a muscle in his ribs.  The history is provided by the patient.  History reviewed. No pertinent past medical history. There are no problems to display for this patient.  History reviewed. No pertinent surgical history.  Home Medications    Prior to Admission medications   Medication Sig Start Date End Date Taking? Authorizing Provider  ibuprofen (ADVIL) 800 MG tablet Take 1 tablet (800 mg total) by mouth 3 (three) times daily. 08/27/20   Wieters, Junius Creamer, PA-C   Family History Family History  Problem Relation Age of Onset   Hypertension Mother    Social History Social History   Tobacco Use   Smoking status: Every Day    Packs/day: 0.25    Types: Cigarettes   Smokeless tobacco: Never  Vaping Use   Vaping Use: Never used  Substance Use Topics   Alcohol use: Never   Drug use: Never   Allergies   Patient has no known allergies.  Review of Systems Review of Systems Pertinent findings noted in history of present illness.   Physical Exam Triage Vital Signs ED Triage Vitals  Enc Vitals Group      BP 12/04/20 0827 (!) 147/82     Pulse Rate 12/04/20 0827 72     Resp 12/04/20 0827 18     Temp 12/04/20 0827 98.3 F (36.8 C)     Temp Source 12/04/20 0827 Oral     SpO2 12/04/20 0827 98 %     Weight --      Height --      Head Circumference --      Peak Flow --      Pain Score 12/04/20 0826 5     Pain Loc --      Pain Edu? --      Excl. in GC? --   No data found.  Updated Vital Signs BP 119/77 (BP Location: Right Arm)    Pulse 98    Temp 98 F (36.7 C) (Oral)    Resp 18    SpO2 98%   Physical Exam Constitutional:      Appearance: He is ill-appearing.  HENT:     Head: Normocephalic and atraumatic.     Salivary Glands: Right salivary gland is not diffusely enlarged or tender. Left salivary gland is not diffusely enlarged or tender.     Right Ear: Tympanic membrane, ear canal and external ear normal.     Left Ear: Tympanic membrane, ear canal and external ear normal.     Nose: Congestion  and rhinorrhea present. Rhinorrhea is clear.     Right Sinus: No maxillary sinus tenderness or frontal sinus tenderness.     Left Sinus: No maxillary sinus tenderness.     Mouth/Throat:     Mouth: Mucous membranes are moist.     Pharynx: Pharyngeal swelling, posterior oropharyngeal erythema and uvula swelling present.     Tonsils: No tonsillar exudate. 0 on the right. 0 on the left.  Cardiovascular:     Rate and Rhythm: Normal rate and regular rhythm.     Pulses: Normal pulses.  Pulmonary:     Effort: Pulmonary effort is normal. No accessory muscle usage, prolonged expiration or respiratory distress.     Breath sounds: No stridor. Examination of the right-middle field reveals decreased breath sounds. Examination of the right-lower field reveals decreased breath sounds. Decreased breath sounds present. No wheezing, rhonchi or rales.     Comments: Turbulent breath sounds in upper lung fields without wheeze, rale, rhonchi.  Significantly decreased breath sounds on the right. Abdominal:      General: Abdomen is flat. Bowel sounds are normal.     Palpations: Abdomen is soft.  Musculoskeletal:        General: Normal range of motion.  Lymphadenopathy:     Cervical: Cervical adenopathy present.     Right cervical: Superficial cervical adenopathy and posterior cervical adenopathy present.     Left cervical: Superficial cervical adenopathy and posterior cervical adenopathy present.  Skin:    General: Skin is warm and dry.  Neurological:     General: No focal deficit present.     Mental Status: He is alert and oriented to person, place, and time.     Motor: Motor function is intact.     Coordination: Coordination is intact.     Gait: Gait is intact.     Deep Tendon Reflexes: Reflexes are normal and symmetric.  Psychiatric:        Attention and Perception: Attention and perception normal.        Mood and Affect: Mood and affect normal.        Speech: Speech normal.        Behavior: Behavior normal. Behavior is cooperative.        Thought Content: Thought content normal.    Visual Acuity Right Eye Distance:   Left Eye Distance:   Bilateral Distance:    Right Eye Near:   Left Eye Near:    Bilateral Near:     UC Couse / Diagnostics / Procedures:    EKG  Radiology No results found.  Procedures Procedures (including critical care time)  UC Diagnoses / Final Clinical Impressions(s)   I have reviewed the triage vital signs and the nursing notes.  Pertinent labs & imaging results that were available during my care of the patient were reviewed by me and considered in my medical decision making (see chart for details).   Final diagnoses:  Viral respiratory illness  Community acquired pneumonia of right lower lobe of lung   Unclear whether patient noticed that I was pain particular attention to the right side of his back when performing auscultation of his lungs, but his complaint of pain is consistent with my finding of significantly decreased breath sounds in that  area.  I believe patient may be having bacterial superinfection on top of initial influenza or influenza-like infection.  I believe patient would benefit from a 5-day course of Augmentin and azithromycin for presumed community-acquired pneumonia in his right lower lobe.  Return precautions advised.  Note provided for work.  ED Prescriptions     Medication Sig Dispense Auth. Provider   amoxicillin-clavulanate (AUGMENTIN) 875-125 MG tablet Take 1 tablet by mouth every 12 (twelve) hours for 5 days. 10 tablet Theadora Rama Scales, PA-C   azithromycin (ZITHROMAX) 250 MG tablet Take 2 tablets (500 mg total) by mouth daily for 3 days. Take first 2 tablets together, then 1 every day until finished. 6 tablet Theadora Rama Scales, PA-C      PDMP not reviewed this encounter.  Pending results:  Labs Reviewed - No data to display  Medications Ordered in UC: Medications - No data to display  Disposition Upon Discharge:  Condition: stable for discharge home Home: take medications as prescribed; routine discharge instructions as discussed; follow up as advised.  Patient presented with an acute illness with associated systemic symptoms and significant discomfort requiring urgent management. In my opinion, this is a condition that a prudent lay person (someone who possesses an average knowledge of health and medicine) may potentially expect to result in complications if not addressed urgently such as respiratory distress, impairment of bodily function or dysfunction of bodily organs.   Routine symptom specific, illness specific and/or disease specific instructions were discussed with the patient and/or caregiver at length.   As such, the patient has been evaluated and assessed, work-up was performed and treatment was provided in alignment with urgent care protocols and evidence based medicine.  Patient/parent/caregiver has been advised that the patient may require follow up for further testing and  treatment if the symptoms continue in spite of treatment, as clinically indicated and appropriate.  The patient was tested for COVID-19, Influenza and/or RSV, then the patient/parent/guardian was advised to isolate at home pending the results of his/her diagnostic coronavirus test and potentially longer if theyre positive. I have also advised pt that if his/her COVID-19 test returns positive, it's recommended to self-isolate for at least 10 days after symptoms first appeared AND until fever-free for 24 hours without fever reducer AND other symptoms have improved or resolved. Discussed self-isolation recommendations as well as instructions for household member/close contacts as per the [facility] and [place] DHHS, and also gave patient the COVID packet with this information.  Patient/parent/caregiver has been advised to return to the [facility] or PCP in 3-5 days if no better; to PCP or the Emergency Department if new signs and symptoms develop, or if the current signs or symptoms continue to change or worsen for further workup, evaluation and treatment as clinically indicated and appropriate  The patient will follow up with their current PCP if and as advised. If the patient does not currently have a PCP we will assist them in obtaining one.   The patient may need specialty follow up if the symptoms continue, in spite of conservative treatment and management, for further workup, evaluation, consultation and treatment as clinically indicated and appropriate.  Patient/parent/caregiver verbalized understanding and agreement of plan as discussed.  All questions were addressed during visit.  Please see discharge instructions below for further details of plan.  Discharge Instructions:   Discharge Instructions      Based on physical exam findings, I believe that you have a mild bacterial pneumonia in the right lower lobe of your lungs.  To treat this, please begin 2 antibiotics, Augmentin 1 tablet twice daily for 5 days  and azithromycin 2 tablets twice daily for 3 days.  I believe that you should be fit and ready for work on Monday morning, I  provided you with a note to return to work.  Please follow-up with your primary care provider or with urgent care if you have not noticed any improvement of your symptoms before the weekend.        Theadora Rama Scales, PA-C 01/19/21 1434

## 2021-01-19 NOTE — Discharge Instructions (Addendum)
Based on physical exam findings, I believe that you have a mild bacterial pneumonia in the right lower lobe of your lungs.  To treat this, please begin 2 antibiotics, Augmentin 1 tablet twice daily for 5 days and azithromycin 2 tablets twice daily for 3 days.  I believe that you should be fit and ready for work on Monday morning, I provided you with a note to return to work.  Please follow-up with your primary care provider or with urgent care if you have not noticed any improvement of your symptoms before the weekend.

## 2021-03-16 ENCOUNTER — Encounter (HOSPITAL_COMMUNITY): Payer: Self-pay

## 2021-03-16 ENCOUNTER — Emergency Department (HOSPITAL_COMMUNITY): Payer: BC Managed Care – PPO

## 2021-03-16 ENCOUNTER — Emergency Department (HOSPITAL_COMMUNITY)
Admission: EM | Admit: 2021-03-16 | Discharge: 2021-03-16 | Disposition: A | Discharge status: Home / Self Care | Payer: BC Managed Care – PPO | Attending: Emergency Medicine | Admitting: Emergency Medicine

## 2021-03-16 DIAGNOSIS — R42 Dizziness and giddiness: Secondary | ICD-10-CM | POA: Insufficient documentation

## 2021-03-16 DIAGNOSIS — R519 Headache, unspecified: Secondary | ICD-10-CM | POA: Diagnosis not present

## 2021-03-16 DIAGNOSIS — Z20822 Contact with and (suspected) exposure to covid-19: Secondary | ICD-10-CM | POA: Diagnosis not present

## 2021-03-16 LAB — RESP PANEL BY RT-PCR (FLU A&B, COVID) ARPGX2
Influenza A by PCR: NEGATIVE
Influenza B by PCR: NEGATIVE
SARS Coronavirus 2 by RT PCR: NEGATIVE

## 2021-03-16 MED ORDER — MECLIZINE HCL 25 MG PO TABS
25.0000 mg | ORAL_TABLET | Freq: Once | ORAL | Status: AC
  Administered 2021-03-16: 25 mg via ORAL
  Filled 2021-03-16: qty 1

## 2021-03-16 MED ORDER — KETOROLAC TROMETHAMINE 30 MG/ML IJ SOLN
30.0000 mg | Freq: Once | INTRAMUSCULAR | Status: AC
  Administered 2021-03-16: 30 mg via INTRAMUSCULAR
  Filled 2021-03-16: qty 1

## 2021-03-16 MED ORDER — PROCHLORPERAZINE EDISYLATE 10 MG/2ML IJ SOLN
10.0000 mg | Freq: Once | INTRAMUSCULAR | Status: AC
  Administered 2021-03-16: 10 mg via INTRAMUSCULAR
  Filled 2021-03-16: qty 2

## 2021-03-16 MED ORDER — MECLIZINE HCL 25 MG PO TABS: 25.0000 mg | ORAL_TABLET | Freq: Three times a day (TID) | ORAL | 0 refills | Status: AC | PRN

## 2021-03-16 MED ORDER — ACETAMINOPHEN 325 MG PO TABS
650.0000 mg | ORAL_TABLET | Freq: Once | ORAL | Status: AC
  Administered 2021-03-16: 650 mg via ORAL
  Filled 2021-03-16: qty 2

## 2021-03-16 NOTE — ED Provider Triage Note (Signed)
Emergency Medicine Provider Triage Evaluation Note  Kevin Knapp , a 56 y.o. male  was evaluated in triage.  Pt complains of headache x3 days associated with dizziness and nausea.  No emesis.  No recent head injury.  He is not currently on blood thinners.  No history of migraines.  Patient also endorses rhinorrhea.  No sick contacts or known COVID exposures.  Headache associated with photophobia.  Denies visual changes, speech changes, and unilateral weakness.  Review of Systems  Positive: headache Negative: emesis  Physical Exam  BP (!) 146/95 (BP Location: Right Arm)    Pulse 64    Temp 98.6 F (37 C) (Oral)    Resp 16    Ht 6\' 7"  (2.007 m)    Wt 83.9 kg    SpO2 100%    BMI 20.84 kg/m  Gen:   Awake, no distress   Resp:  Normal effort  MSK:   Moves extremities without difficulty  Other:  Cranial nerves grossly intact, no facial droop  Medical Decision Making  Medically screening exam initiated at 3:09 PM.  Appropriate orders placed.  [facility] was informed that the remainder of the evaluation will be completed by another provider, this initial triage assessment does not replace that evaluation, and the importance of remaining in the ED until their evaluation is complete.  CT head given severity of headache COVID test Tylenol given in triage, will most likely need migraine cocktail.   Suzy Bouchard, [place] 03/16/21 1514

## 2021-03-16 NOTE — ED Triage Notes (Signed)
"  Headache, dizziness, nausea since Sunday" per pt  Denies recent injury.

## 2021-03-16 NOTE — Discharge Instructions (Signed)
Take Tylenol ibuprofen as needed for headache.  Take the meclizine to help with the dizziness.  Return to the emergency room for worsening symptoms.  Follow-up with your doctor to be rechecked

## 2021-03-16 NOTE — ED Provider Notes (Signed)
[facility]-EMERGENCY DEPT Provider Note   CSN: [id number] Arrival date & time: 03/16/21  1420     History    Kevin Knapp is a 56 y.o. male.  HPI  Patient denies any significant medical history.  He does not have history of recurrent headaches.  Patient states over the last several days he has had trouble with headache dizziness or nausea.  He thinks he may have felt feverish but has not measured any fevers at home.  He is not having any trouble with cough or sore throat.  No earaches.  Patient feels like the room is spinning on occasion.  He has not had any trouble with speech.  He is not having any trouble with weakness.  Today he was driving and felt very dizzy and had trouble walking so he came to the ED.  Home Medications Prior to Admission medications   Medication Sig Start Date End Date Taking? Authorizing Provider  meclizine (ANTIVERT) 25 MG tablet Take 1 tablet (25 mg total) by mouth 3 (three) times daily as needed for up to 10 days for dizziness. 03/16/21 03/26/21 Yes Linwood Dibbles, MD  ibuprofen (ADVIL) 800 MG tablet Take 1 tablet (800 mg total) by mouth 3 (three) times daily. 08/27/20   Wieters, Hallie C, PA-C      Allergies    Patient has no known allergies.    Review of Systems   Review of Systems  Neurological:  Positive for headaches. Negative for seizures, speech difficulty and numbness.   Physical Exam Updated Vital Signs BP (!) 166/87    Pulse (!) 46    Temp 98.5 F (36.9 C) (Oral)    Resp 13    Ht 2.007 m (6\' 7" )    Wt 83.9 kg    SpO2 100%    BMI 20.84 kg/m  Physical Exam Vitals and nursing note reviewed.  Constitutional:      General: He is not in acute distress.    Appearance: He is well-developed.  HENT:     Head: Normocephalic and atraumatic.     Right Ear: Tympanic membrane and external ear normal.     Left Ear: Tympanic membrane and external ear normal.  Eyes:     General: No scleral icterus.       Right eye: No discharge.         Left eye: No discharge.     Conjunctiva/sclera: Conjunctivae normal.  Neck:     Trachea: No tracheal deviation.  Cardiovascular:     Rate and Rhythm: Normal rate and regular rhythm.  Pulmonary:     Effort: Pulmonary effort is normal. No respiratory distress.     Breath sounds: Normal breath sounds. No stridor. No wheezing or rales.  Abdominal:     General: Bowel sounds are normal. There is no distension.     Palpations: Abdomen is soft.     Tenderness: There is no abdominal tenderness. There is no guarding or rebound.  Musculoskeletal:        General: No tenderness.     Cervical back: Normal range of motion and neck supple. No rigidity.  Skin:    General: Skin is warm and dry.     Findings: No rash.  Neurological:     Mental Status: He is alert and oriented to person, place, and time.     Cranial Nerves: No cranial nerve deficit.     Sensory: No sensory deficit.     Motor: No abnormal muscle tone  or seizure activity.     Coordination: Coordination normal.     Comments: No pronator drift bilateral upper extrem, able to hold both legs off bed for 5 seconds, sensation intact in all extremities, no visual field cuts, no left or right sided neglect, normal finger-nose exam bilaterally, no nystagmus noted  No facial droop, extraocular movements intact, tongue midline    ED Results / Procedures / Treatments   Labs (all labs ordered are listed, but only abnormal results are displayed) Labs Reviewed  RESP PANEL BY RT-PCR (FLU A&B, COVID) [id number]    EKG None  Radiology CT Head Wo Contrast  Result Date: 03/16/2021 CLINICAL DATA:  Headache, sudden, severe EXAM: CT HEAD WITHOUT CONTRAST TECHNIQUE: Contiguous axial images were obtained from the base of the skull through the vertex without intravenous contrast. RADIATION DOSE REDUCTION: This exam was performed according to the departmental dose-optimization program which includes automated exposure control, adjustment of the mA and/or  kV according to patient size and/or use of iterative reconstruction technique. COMPARISON:  None. FINDINGS: Brain: There is no acute intracranial hemorrhage, mass effect, or edema. Gray-white differentiation is preserved. There is no extra-axial fluid collection. Ventricles and sulci are within normal limits in size and configuration. Vascular: No hyperdense vessel or unexpected calcification. Skull: Calvarium is unremarkable. Sinuses/Orbits: No acute finding. Other: None. IMPRESSION: No acute intracranial abnormality. Electronically Signed   By: Guadlupe Spanish M.D.   On: 03/16/2021 15:40    Procedures Procedures    Medications Ordered in ED Medications  ketorolac (TORADOL) 30 MG/ML injection 30 mg (has no administration in time range)  prochlorperazine (COMPAZINE) injection 10 mg (has no administration in time range)  meclizine (ANTIVERT) tablet 25 mg (has no administration in time range)  acetaminophen (TYLENOL) tablet 650 mg (650 mg Oral Given 03/16/21 1524)    ED Course/ Medical Decision Making/ A&P                           Medical Decision Making Risk Prescription drug management.   Headache Head CT without signs of hemorrhage or other acute abnormality.  Patient does not have any neck discomfort.  Exam is not suggestive of meningitis.  Dizziness No focal deficits on neurologic exam.  Symptoms concerning the possibility of peripheral vertigo but central causes of vertigo are also a concern with patient's balance disturbance.  His initial head CT is negative.  I discussed doing an MRI to rule out posterior circulation stroke.  Patient does not think that is necessary at this time.  He states he has been waiting for a good portion of the day and would like to try medications for his headache and dizziness.  He understands my concerns about the possibly a stroke causing symptoms and explained to him that this would warrant hospitalization for further treatment.  He understands.  His wife  is present and also agrees to follow-up if his symptoms worsen or do not improve  Hypertension No history of same.  Recommend outpatient follow-up with PCP.        Final Clinical Impression(s) / ED Diagnoses Final diagnoses:  Vertigo  Nonintractable headache, unspecified chronicity pattern, unspecified headache type    Rx / DC Orders ED Discharge Orders          Ordered    meclizine (ANTIVERT) 25 MG tablet  3 times daily PRN        03/16/21 2022  Linwood Dibbles, MD 03/16/21 2022

## 2021-12-08 ENCOUNTER — Emergency Department (HOSPITAL_COMMUNITY): Payer: BC Managed Care – PPO

## 2021-12-08 ENCOUNTER — Encounter (HOSPITAL_COMMUNITY): Payer: Self-pay

## 2021-12-08 ENCOUNTER — Other Ambulatory Visit: Payer: Self-pay

## 2021-12-08 ENCOUNTER — Emergency Department (HOSPITAL_COMMUNITY)
Admission: EM | Admit: 2021-12-08 | Discharge: 2021-12-08 | Disposition: A | Discharge status: Home / Self Care | Payer: BC Managed Care – PPO | Attending: Emergency Medicine | Admitting: Emergency Medicine

## 2021-12-08 DIAGNOSIS — R42 Dizziness and giddiness: Secondary | ICD-10-CM | POA: Diagnosis not present

## 2021-12-08 DIAGNOSIS — R519 Headache, unspecified: Secondary | ICD-10-CM | POA: Diagnosis not present

## 2021-12-08 LAB — COMPREHENSIVE METABOLIC PANEL
ALT: 12 U/L (ref 0–44)
AST: 15 U/L (ref 15–41)
Albumin: 4.2 g/dL (ref 3.5–5.0)
Alkaline Phosphatase: 54 U/L (ref 38–126)
Anion gap: 8 (ref 5–15)
BUN: 13 mg/dL (ref 6–20)
CO2: 25 mmol/L (ref 22–32)
Calcium: 8.9 mg/dL (ref 8.9–10.3)
Chloride: 105 mmol/L (ref 98–111)
Creatinine, Ser: 1 mg/dL (ref 0.61–1.24)
GFR, Estimated: >60 mL/min (ref >60)
Glucose, Bld: 94 mg/dL (ref 70–99)
Potassium: 3.7 mmol/L (ref 3.5–5.1)
Sodium: 138 mmol/L (ref 135–145)
Total Bilirubin: 1.4 mg/dL — ABNORMAL HIGH (ref 0.3–1.2)
Total Protein: 7 g/dL (ref 6.5–8.1)

## 2021-12-08 LAB — CBC WITH DIFFERENTIAL/PLATELET
Abs Immature Granulocytes: 0.03 10*3/uL (ref 0.00–0.07)
Basophils Absolute: 0.1 10*3/uL (ref 0.0–0.1)
Basophils Relative: 1 %
Eosinophils Absolute: 0 10*3/uL (ref 0.0–0.5)
Eosinophils Relative: 0 %
HCT: 37.2 % — ABNORMAL LOW (ref 39.0–52.0)
Hemoglobin: 13.6 g/dL (ref 13.0–17.0)
Immature Granulocytes: 0 %
Lymphocytes Relative: 12 %
Lymphs Abs: 1.2 10*3/uL (ref 0.7–4.0)
MCH: 32.5 pg (ref 26.0–34.0)
MCHC: 36.6 g/dL — ABNORMAL HIGH (ref 30.0–36.0)
MCV: 88.8 fL (ref 80.0–100.0)
Monocytes Absolute: 0.4 10*3/uL (ref 0.1–1.0)
Monocytes Relative: 4 %
Neutro Abs: 8.3 10*3/uL — ABNORMAL HIGH (ref 1.7–7.7)
Neutrophils Relative %: 83 %
Platelets: 246 10*3/uL (ref 150–400)
RBC: 4.19 MIL/uL — ABNORMAL LOW (ref 4.22–5.81)
RDW: 11.9 % (ref 11.5–15.5)
WBC: 10 10*3/uL (ref 4.0–10.5)
nRBC: 0 % (ref 0.0–0.2)

## 2021-12-08 MED ORDER — IBUPROFEN 200 MG PO TABS
600.0000 mg | ORAL_TABLET | Freq: Once | ORAL | Status: AC
  Administered 2021-12-08: 600 mg via ORAL
  Filled 2021-12-08: qty 3

## 2021-12-08 MED ORDER — GADOBUTROL 1 MMOL/ML IV SOLN
8.5000 mL | Freq: Once | INTRAVENOUS | Status: AC | PRN
  Administered 2021-12-08: 8.5 mL via INTRAVENOUS

## 2021-12-08 MED ORDER — MECLIZINE HCL 25 MG PO TABS
50.0000 mg | ORAL_TABLET | Freq: Once | ORAL | Status: AC
  Administered 2021-12-08: 50 mg via ORAL
  Filled 2021-12-08: qty 2

## 2021-12-08 MED ORDER — MECLIZINE HCL 25 MG PO TABS: 25.0000 mg | ORAL_TABLET | Freq: Three times a day (TID) | ORAL | 2 refills | Status: DC | PRN

## 2021-12-08 NOTE — ED Triage Notes (Signed)
Patient said for a few months ago he has had dizzy spells, confusion, headaches. The doctor diagnosed him with vertigo. He said he is feeling more off balance. He feels like the world is spinning. Constant headaches now. Sensitive to noises, vision changes.

## 2021-12-08 NOTE — ED Notes (Signed)
Pt taken to MRI  

## 2021-12-08 NOTE — Discharge Instructions (Addendum)
Please call to follow-up with the neurology clinic if you do not hear from them by Monday.

## 2021-12-08 NOTE — ED Provider Notes (Signed)
[facility]-EMERGENCY DEPT Provider Note   CSN: [id number] Arrival date & time: 12/08/21  1259     History  Chief Complaint  Patient presents with   Dizziness    Kevin Knapp is a 56 y.o. male presenting to the emergency department with complaint of dizziness.  He says this been ongoing for months.  He reports he has a pressure sensation all day that he is "moving when I am not moving".  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  He reports he has had a persistent headache on a near daily basis for months.  He says he feels like he is "swimming" all day long.  He does not have a PCP and has not seen a neurologist or specialist.  He reports her symptoms did improve and was prescribed meclizine, after visiting the ED with complaint of February.  At that time he was recommended to stay for an MRI but did not want to stay due to the prolonged wait.  HPI     Home Medications Prior to Admission medications   Medication Sig Start Date End Date Taking? Authorizing Provider  meclizine (ANTIVERT) 25 MG tablet Take 1 tablet (25 mg total) by mouth 3 (three) times daily as needed for up to 30 doses for dizziness. 12/08/21  Yes Terald Sleeper, MD  ibuprofen (ADVIL) 800 MG tablet Take 1 tablet (800 mg total) by mouth 3 (three) times daily. 08/27/20   Wieters, Hallie C, PA-C      Allergies    Patient has no known allergies.    Review of Systems   Review of Systems  Physical Exam Updated Vital Signs BP 124/75 (BP Location: Right Arm)   Pulse (!) 55   Temp 98.2 F (36.8 C) (Oral)   Resp 18   SpO2 100%  Physical Exam Constitutional:      General: He is not in acute distress. HENT:     Head: Normocephalic and atraumatic.  Eyes:     Conjunctiva/sclera: Conjunctivae normal.     Pupils: Pupils are equal, round, and reactive to light.  Cardiovascular:     Rate and Rhythm: Normal rate and  regular rhythm.  Pulmonary:     Effort: Pulmonary effort is normal. No respiratory distress.  Abdominal:     General: There is no distension.     Tenderness: There is no abdominal tenderness.  Skin:    General: Skin is warm and dry.  Neurological:     Mental Status: He is alert and oriented to person, place, and time. Mental status is at baseline.     Sensory: No sensory deficit.     Motor: No weakness.     Comments: Unilateral rightward gaze nystagmus  Psychiatric:        Mood and Affect: Mood normal.        Behavior: Behavior normal.     ED Results / Procedures / Treatments   Labs (all labs ordered are listed, but only abnormal results are displayed) Labs Reviewed  COMPREHENSIVE METABOLIC PANEL - Abnormal; Notable for the following components:      Result Value   Total Bilirubin 1.4 (*)    All other components within normal limits  CBC WITH DIFFERENTIAL/PLATELET - Abnormal; Notable for the following components:   RBC 4.19 (*)    HCT 37.2 (*)    MCHC 36.6 (*)    Neutro Abs 8.3 (*)  All other components within normal limits    EKG EKG Interpretation  Date/Time:  Wednesday December 08 2021 13:56:22 EDT Ventricular Rate:  85 PR Interval:  148 QRS Duration: 81 QT Interval:  352 QTC Calculation: 419 R Axis:   88 Text Interpretation: Sinus rhythm Right atrial enlargement Probable left ventricular hypertrophy Lead(s) I were not used for morphology analysis Confirmed by Alona Bene [id number]) on 12/08/2021 2:08:34 PM  Radiology MR Venogram Head  Result Date: 12/08/2021 CLINICAL DATA:  Initial evaluation for possible dural venous sinus thrombosis. EXAM: MR VENOGRAM HEAD WITHOUT AND WITH CONTRAST TECHNIQUE: Angiographic images of the intracranial venous structures were acquired using MRV technique without and with intravenous contrast. CONTRAST:  8.76mL GADAVIST GADOBUTROL 1 MMOL/ML IV SOLN COMPARISON:  Prior noncontrast brain MRI from earlier the same day. FINDINGS: Normal  flow related signal and enhancement seen throughout the superior sagittal sinus to the torcula. Right transverse sinus dominant and widely patent as is the right sigmoid sinus, right jugular bulb, and proximal right internal jugular vein. Left transverse sinus fills appropriately with contrast material, with no filling defect to suggest acute or chronic thrombosis. Previously noted signal abnormality on noncontrast brain MRI is most consistent with slow flow. The left sigmoid sinus, left jugular bulb, and proximal left internal jugular vein are patent. Straight sinus, vein of Galen, internal cerebral veins, and basal veins of Rosenthal are patent. No appreciable abnormality about the cavernous sinus. No visible cortical vein thrombosis. No other pathologic enhancement within the brain. IMPRESSION: Normal intracranial MRV. No evidence for dural sinus thrombosis. Previously noted signal abnormality involving the left transverse sinus is most consistent with slow flow. Electronically Signed   By: Rise Mu M.D.   On: 12/08/2021 21:03   MR BRAIN WO CONTRAST  Result Date: 12/08/2021 CLINICAL DATA:  Initial evaluation for chronic headache, increased frequency. EXAM: MRI HEAD WITHOUT CONTRAST TECHNIQUE: Multiplanar, multiecho pulse sequences of the brain and surrounding structures were obtained without intravenous contrast. COMPARISON:  CT from earlier the same day. FINDINGS: Brain: Cerebral volume within normal limits. Mild patchy T2/FLAIR hyperintensity noted within the periventricular and deep white matter both cerebral hemispheres, most likely related chronic microvascular ischemic disease, minimal for age. No evidence for acute or subacute infarct. Gray-white matter differentiation maintained. No areas of chronic cortical infarction. No acute intracranial hemorrhage. Single chronic microhemorrhage noted within the posterior left frontal centrum semi ovale, of doubtful significance in isolation. No mass  lesion, midline shift or mass effect. No hydrocephalus or extra-axial fluid collection. Pituitary gland and suprasellar region normal. Vascular: Major intracranial arterial vascular flow voids are maintained. Asymmetric FLAIR hyperintensity noted involving the left transverse sinus, favored to rib be related to slow/sluggish flow. Skull and upper cervical spine: Craniocervical junction normal. Bone marrow signal intensity within normal limits. 2 cm non expansile T1 hyperintense lesion at the right parietal calvarium noted, nonspecific, but likely a benign hemangioma. No scalp soft tissue abnormality. Sinuses/Orbits: Globes orbital soft tissues within normal limits. Mild scattered mucosal thickening noted about the ethmoidal air cells. Paranasal sinuses are otherwise clear. No mastoid effusion. Other: None. IMPRESSION: 1. No acute intracranial abnormality. 2. Asymmetric FLAIR hyperintensity involving the left transverse sinus, favored to be related to slow/sluggish flow. If there is clinical concern for possible dural venous sinus thrombosis, further evaluation with dedicated MRV, with and without contrast, would be suggested for further evaluation. 3. Mild T2/FLAIR hyperintensity involving the supratentorial cerebral white matter, nonspecific, but most likely related to chronic microvascular ischemic disease, minimal for  age. Electronically Signed   By: Rise Mu M.D.   On: 12/08/2021 18:52   CT Head Wo Contrast  Result Date: 12/08/2021 CLINICAL DATA:  Dizziness EXAM: CT HEAD WITHOUT CONTRAST TECHNIQUE: Contiguous axial images were obtained from the base of the skull through the vertex without intravenous contrast. RADIATION DOSE REDUCTION: This exam was performed according to the departmental dose-optimization program which includes automated exposure control, adjustment of the mA and/or kV according to patient size and/or use of iterative reconstruction technique. COMPARISON:  03/16/2021 FINDINGS:  Brain: No evidence of acute infarction, hemorrhage, hydrocephalus, extra-axial collection or mass lesion/mass effect. Vascular: No hyperdense vessel or unexpected calcification. Skull: Normal. Negative for fracture or focal lesion. Sinuses/Orbits: No acute finding. Other: None. IMPRESSION: Normal head CT without contrast for age Electronically Signed   By: Judie Petit.  Shick M.D.   On: 12/08/2021 14:51    Procedures Procedures    Medications Ordered in ED Medications  ibuprofen (ADVIL) tablet 600 mg (600 mg Oral Given 12/08/21 1500)  meclizine (ANTIVERT) tablet 50 mg (50 mg Oral Given 12/08/21 1621)  gadobutrol (GADAVIST) 1 MMOL/ML injection 8.5 mL (8.5 mLs Intravenous Contrast Given 12/08/21 2011)    ED Course/ Medical Decision Making/ A&P Clinical Course as of 12/08/21 2121  Wed Dec 08, 2021  2013 Patient been reassessed and updated regarding MRI findings, which concerning for sluggish flow through the left transverse sinus on MR, which may be related to a chronic thrombosis.  I spoke to radiologist who recommended an MRV for better visualization of this, and this is ordered. [MT]    Clinical Course User Index [MT] Cartina Brousseau, Kermit Balo, MD                           Medical Decision Making Amount and/or Complexity of Data Reviewed Radiology: ordered.  Risk OTC drugs. Prescription drug management.   This patient presents to the ED with concern for persistent dizziness, lightheadedness, headaches, vertigo. This involves an extensive number of treatment options, and is a complaint that carries with it a high risk of complications and morbidity.  The differential diagnosis includes peripheral vertigo versus central vertigo including cerebellar lesion, stroke or mass.  Given his associated headaches I think an MRI to evaluate for mass or cerebellar lesion would be best at this time.  The patient is agreeable to waiting for MRI.  I ordered and personally interpreted labs.  The pertinent results include:   basic labs unremarkable  I ordered imaging studies including MRI brain, MR venogram I independently visualized and interpreted imaging which showed questionable left transverse sinus obstruction on MRI brain, with subsequent venogram does not show evidence of venous thrombosis. I agree with the radiologist interpretation  The patient was maintained on a cardiac monitor.   Per my interpretation the patient's ECG shows NSR with no acute ischemic findings; tall T waves noted, without prior for comparison, but may be physiological for age and habitus.  He does not complain of chest pain or pressure, nor is the history consistent with ACS.  I ordered medication including meclizine for vertigo - it has worked in the past for him.  After the interventions noted above, I reevaluated the patient and found that they have: stayed the same  Based on the patient's medical work-up and exam in the ED, at this time I have a low suspicion for CVA, intracranial mass, sinus thrombosis, meningitis, or other emergent medical condition.  I suspect he would be  reasonably safe and stable for outpatient follow-up.  Referral was placed to neurology.  Meclizine was prescribed for home, as it has worked for him in the past.  I do believe that with his associated headaches, this is more than likely peripheral vertigo.  Perhaps a complex migraine?.  Dispostion:  After consideration of the diagnostic results and the patients response to treatment, I feel that the patent would benefit from neurology outpatient referral, and follow-up.         Final Clinical Impression(s) / ED Diagnoses Final diagnoses:  Vertigo    Rx / DC Orders ED Discharge Orders          Ordered    Ambulatory referral to Neurology       Comments: An appointment is requested in approximately: 2 weeks Persistent headaches, vertigo for 6 months   12/08/21 2117    meclizine (ANTIVERT) 25 MG tablet  3 times daily PRN        12/08/21 2120               Wyvonnia Dusky, MD 12/08/21 2123

## 2021-12-08 NOTE — ED Notes (Signed)
Pt back from MRI, pt states that his dizziness is better when he is laying in bed, but when he gets up or moves his head he is still dizzy.

## 2021-12-08 NOTE — ED Provider Triage Note (Signed)
Emergency Medicine Provider Triage Evaluation Note  Kevin Knapp , a 56 y.o. male  was evaluated in triage.  Pt complains of increased left-sided headaches, photophobia, nausea, dizziness/motion sickness.  He was evaluated at the emergency department in February for similar conditions.  At that time he had a CT scan which was negative and was recommended to stay for an MRI but the patient left due to wait times with the intention to try medicine at home.  He states he has not seen much improvement since discharge at that time.  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  He denies vomiting, abdominal pain, shortness of breath, chest pain  Review of Systems  Positive: As above Negative: As above  Physical Exam  BP 123/81 (BP Location: Left Arm)   Pulse 95   Temp 98.3 F (36.8 C) (Oral)   Resp 15   SpO2 100%  Gen:   Awake, no distress   Resp:  Normal effort  MSK:   Moves extremities without difficulty  Other:    Medical Decision Making  Medically screening exam initiated at 1:45 PM.  Appropriate orders placed.  [facility] was informed that the remainder of the evaluation will be completed by another provider, this initial triage assessment does not replace that evaluation, and the importance of remaining in the ED until their evaluation is complete.     Dorothyann Peng, PA-C 12/08/21 1351

## 2021-12-13 ENCOUNTER — Encounter: Payer: Self-pay | Admitting: Neurology

## 2022-01-08 ENCOUNTER — Other Ambulatory Visit: Payer: Self-pay

## 2022-01-08 ENCOUNTER — Emergency Department (HOSPITAL_COMMUNITY)
Admission: EM | Admit: 2022-01-08 | Discharge: 2022-01-08 | Disposition: A | Discharge status: Home / Self Care | Payer: BC Managed Care – PPO | Attending: Emergency Medicine | Admitting: Emergency Medicine

## 2022-01-08 DIAGNOSIS — K047 Periapical abscess without sinus: Secondary | ICD-10-CM | POA: Diagnosis not present

## 2022-01-08 DIAGNOSIS — K029 Dental caries, unspecified: Secondary | ICD-10-CM | POA: Diagnosis not present

## 2022-01-08 DIAGNOSIS — K0889 Other specified disorders of teeth and supporting structures: Secondary | ICD-10-CM | POA: Diagnosis present

## 2022-01-08 MED ORDER — AMOXICILLIN-POT CLAVULANATE 875-125 MG PO TABS: 1.0000 | ORAL_TABLET | Freq: Two times a day (BID) | ORAL | 0 refills | Status: DC

## 2022-01-08 MED ORDER — IBUPROFEN 600 MG PO TABS: 600.0000 mg | ORAL_TABLET | Freq: Four times a day (QID) | ORAL | 0 refills | Status: DC | PRN

## 2022-01-08 NOTE — ED Triage Notes (Signed)
C/o left sided dental pain x1 day.  Tylenol @ 11am w/o relief.

## 2022-01-08 NOTE — Discharge Instructions (Signed)
Please read and follow all provided instructions.  Your diagnoses today include:  1. Dental infection     The exam and treatment you received today has been provided on an emergency basis only. This is not a substitute for complete medical or dental care.  Tests performed today include: Vital signs. See below for your results today.   Medications prescribed:  Augmentin - antibiotic  You have been prescribed an antibiotic medicine: take the entire course of medicine even if you are feeling better. Stopping early can cause the antibiotic not to work.  Ibuprofen (Motrin, Advil) - anti-inflammatory pain medication Do not exceed 600mg  ibuprofen every 6 hours, take with food  You have been prescribed an anti-inflammatory medication or NSAID. Take with food. Take smallest effective dose for the shortest duration needed for your pain. Stop taking if you experience stomach pain or vomiting.   Take any prescribed medications only as directed.  Home care instructions:  Follow any educational materials contained in this packet.  Follow-up instructions: Please follow-up with your dentist for further evaluation of your symptoms.   Dental Assistance: See attached dental referral and/or resource guide.   Return instructions:  Please return to the Emergency Department if you experience worsening symptoms. Please return if you develop a fever, you develop more swelling in your face or neck, you have trouble breathing or swallowing food. Please return if you have any other emergent concerns.  Additional Information:  Your vital signs today were: There were no vitals taken for this visit. If your blood pressure (BP) was elevated above 135/85 this visit, please have this repeated by your doctor within one month. --------------

## 2022-01-08 NOTE — ED Provider Notes (Signed)
[facility]-EMERGENCY DEPT Provider Note   CSN: [id number] Arrival date & time: 01/08/22  1509     History  No chief complaint on file.   Kevin Knapp is a 56 y.o. male.  Patient presents to the emergency department for evaluation of worsening left upper jaw pain.  Patient has noted some facial swelling.  Using Orajel multiple times overnight without much improvement.  Having difficulty sleeping and eating due to the pain.  He has been taking Tylenol without improvement.  No fevers, ear pain, vomiting.  No voice changes or difficulty swallowing.  He tried to call dentist yesterday but was unable to be seen.       Home Medications Prior to Admission medications   Medication Sig Start Date End Date Taking? Authorizing Provider  amoxicillin-clavulanate (AUGMENTIN) 875-125 MG tablet Take 1 tablet by mouth every 12 (twelve) hours. 01/08/22  Yes Renne Crigler, PA-C  ibuprofen (ADVIL) 600 MG tablet Take 1 tablet (600 mg total) by mouth every 6 (six) hours as needed. 01/08/22  Yes Renne Crigler, PA-C  meclizine (ANTIVERT) 25 MG tablet Take 1 tablet (25 mg total) by mouth 3 (three) times daily as needed for up to 30 doses for dizziness. 12/08/21   Terald Sleeper, MD      Allergies    Patient has no known allergies.    Review of Systems   Review of Systems  Physical Exam Updated Vital Signs There were no vitals taken for this visit. Physical Exam Vitals and nursing note reviewed.  Constitutional:      Appearance: He is well-developed.  HENT:     Head: Normocephalic and atraumatic.     Jaw: No trismus.     Right Ear: Tympanic membrane, ear canal and external ear normal.     Left Ear: Tympanic membrane, ear canal and external ear normal.     Nose: Nose normal.     Mouth/Throat:     Mouth: Mucous membranes are moist.     Dentition: Abnormal dentition. Dental caries present. No dental abscesses.     Pharynx: Uvula midline. No uvula swelling.     Tonsils:  No tonsillar abscesses.     Comments: Multiple missing teeth.  There is inflammation of the gumline at the bases of teeth #10 and 11, probable small, less than 5 mm abscess. Eyes:     Pupils: Pupils are equal, round, and reactive to light.  Neck:     Comments: No neck swelling or Lugwig's angina Musculoskeletal:     Cervical back: Normal range of motion and neck supple.  Skin:    General: Skin is warm and dry.  Neurological:     Mental Status: He is alert.  Psychiatric:        Mood and Affect: Mood normal.     ED Results / Procedures / Treatments   Labs (all labs ordered are listed, but only abnormal results are displayed) Labs Reviewed - No data to display  EKG None  Radiology No results found.  Procedures Procedures    Medications Ordered in ED Medications - No data to display  ED Course/ Medical Decision Making/ A&P    Patient seen and examined. History obtained directly from patient.   Labs/EKG: None ordered.  Imaging: None ordered.  Medications/Fluids: None here  Most recent vital signs reviewed and are as follows: There were no vitals taken for this visit.  Initial impression: dental pain/dental infection  Plan: Discharge to home.   Prescriptions written  for: Augmentin, ibuprofen.  Patient driving home.  Other home care instructions discussed: Avoidance of chewing or other activities that makes the symptoms worse. Eat soft foods if needed and maintain good hydration.   ED return instructions discussed: Encouraged patient to return with worsening facial or neck swelling, difficulty breathing or swallowing, fever.   Follow-up instructions discussed: Patient encouraged to follow-up with provided dental referral, resources -- or their own dentist if able.                           Medical Decision Making Risk Prescription drug management.   Patient presents for dental pain. They do not have a fever and do not appear septic. Exam unconcerning for  Ludwig's angina or other deep tissue infection in neck and I do not feel that advanced imaging is indicated at this time. Low suspicion for PTA, RPA, epiglottis based on exam.   Patient will be treated for dental infection with antibiotic. Encouraged tylenol/NSAIDs as prescribed or as directed on the packaging for pain. Encouraged follow-up with a dentist for definitive and long-term management.          Final Clinical Impression(s) / ED Diagnoses Final diagnoses:  Dental infection    Rx / DC Orders ED Discharge Orders          Ordered    amoxicillin-clavulanate (AUGMENTIN) 875-125 MG tablet  Every 12 hours        01/08/22 1542    ibuprofen (ADVIL) 600 MG tablet  Every 6 hours PRN        01/08/22 1542              Carlisle Cater, PA-C 01/08/22 1544    Long, [facility], MD 01/13/22 1506

## 2022-01-31 IMAGING — CT CT L SPINE W/O CM
3 of 4 series · 10 of 33 positions shown, 12 images · non-contrast
Comparison: None.

CLINICAL DATA: Low back pain radiating down left leg after recently
lifting heavy object

EXAM:
CT LUMBAR SPINE WITHOUT CONTRAST
TECHNIQUE: Multidetector CT imaging of the lumbar spine was performed without
intravenous contrast administration. Multiplanar CT image
reconstructions were also generated.

[Series 7: axial reformats bone · axial · 0.31mm/px · z∈[+1259,+1359]mm · 2 of 153 slices shown, 3 images]
[im 51/153  soft-tissue]
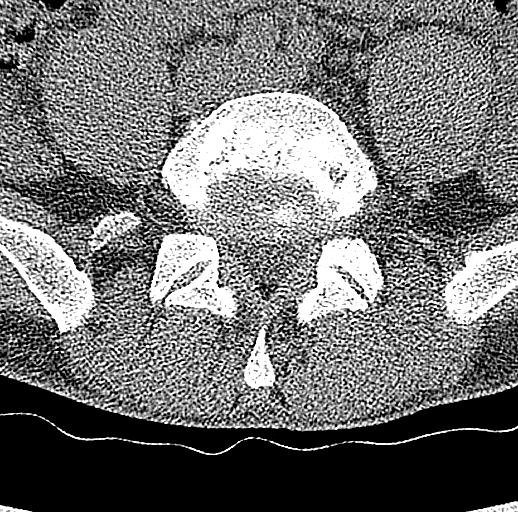
[im 51/153  bone]
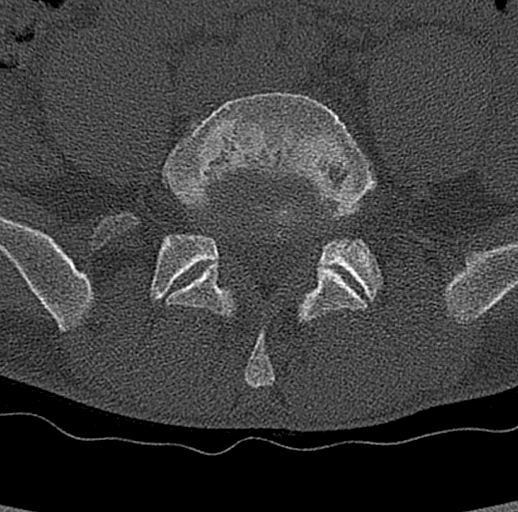
[im 102/153  bone]
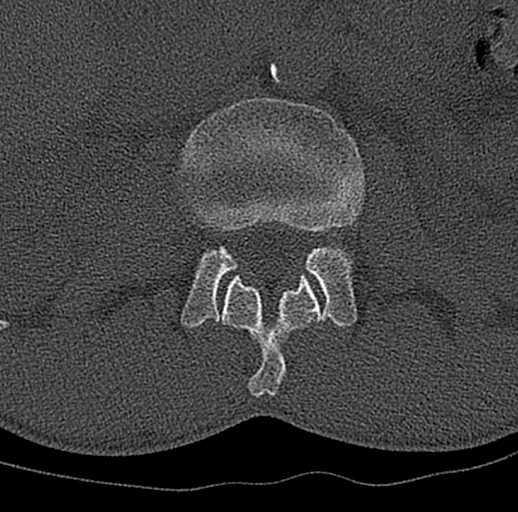

[Series 8: coronal bone · coronal · 0.32mm/px · 3 of 81 slices shown]
[im 17/81  bone]
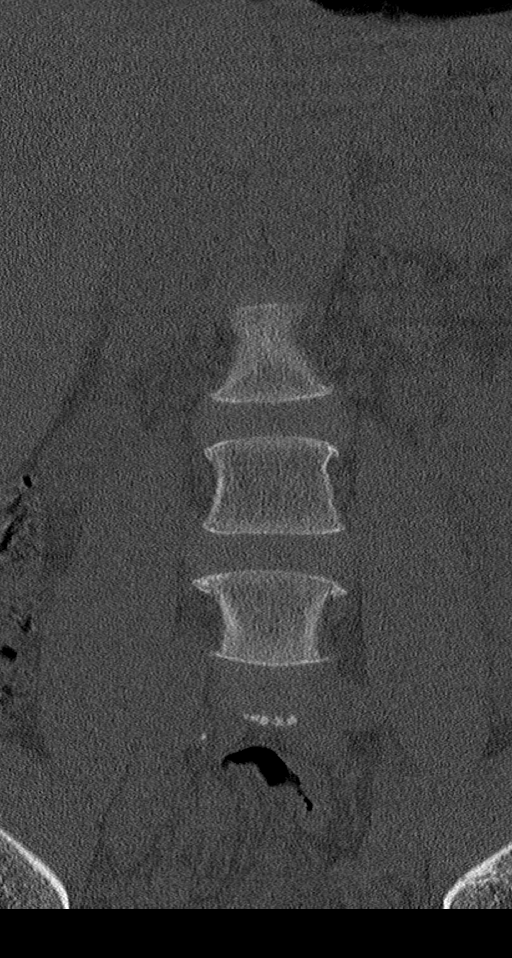
[im 33/81  bone]
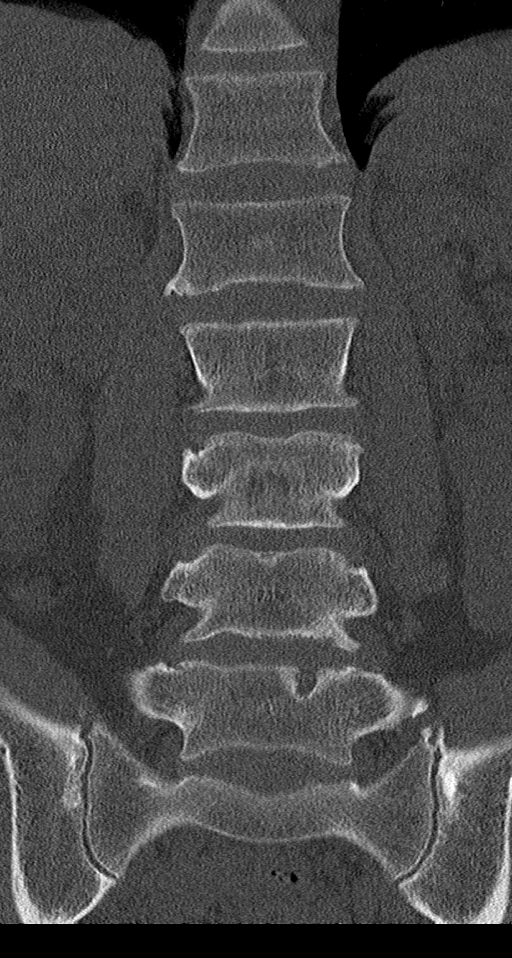
[im 49/81  bone]
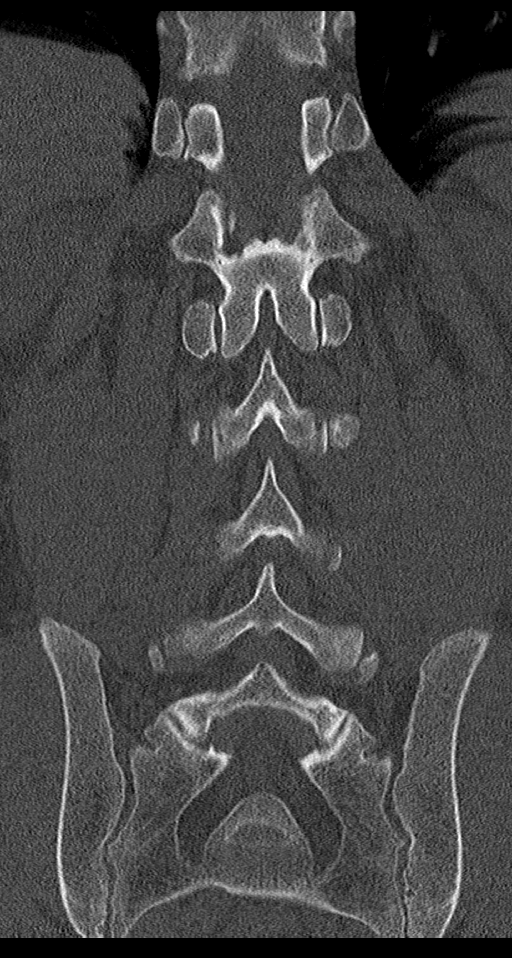

[Series 10: sagittal st · sagittal · 0.31mm/px · 5 of 82 slices shown, 6 images]
[im 28/82  bone]
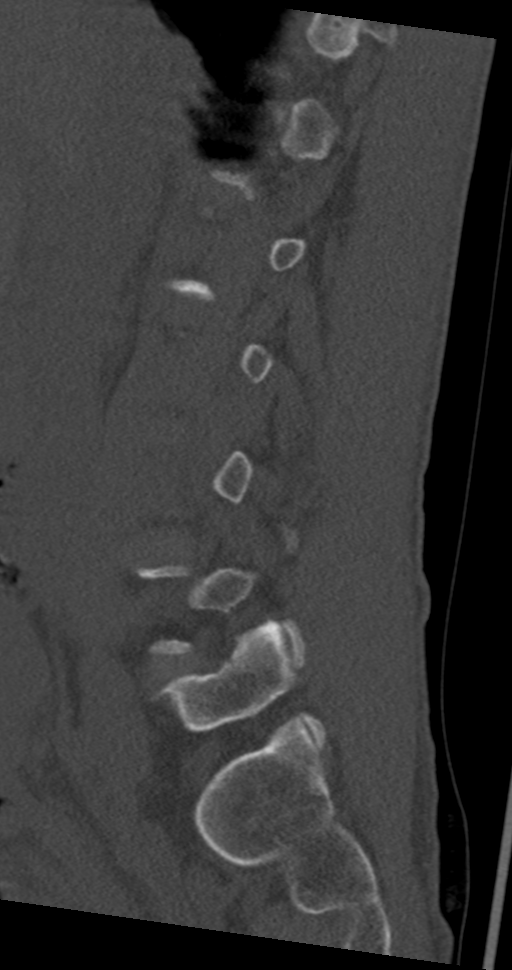
[im 34/82  bone]
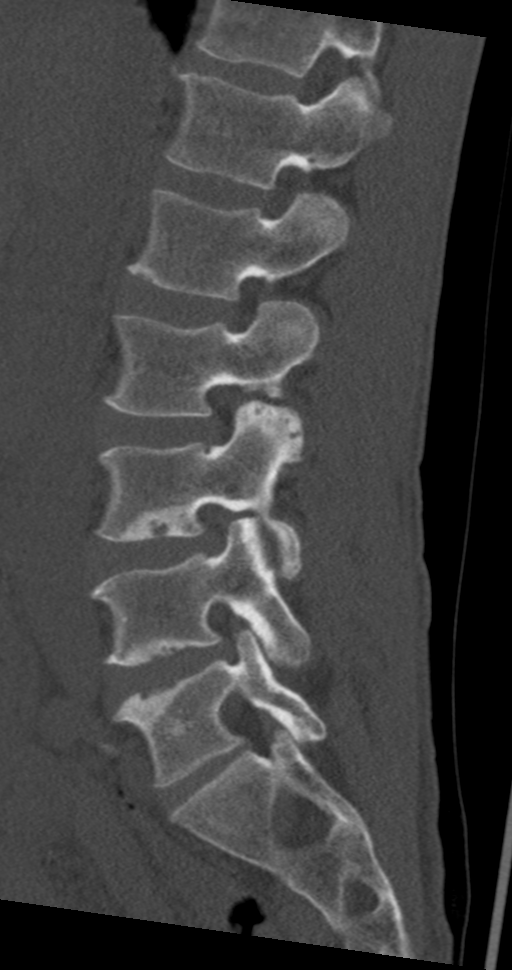
[im 41/82  soft-tissue]
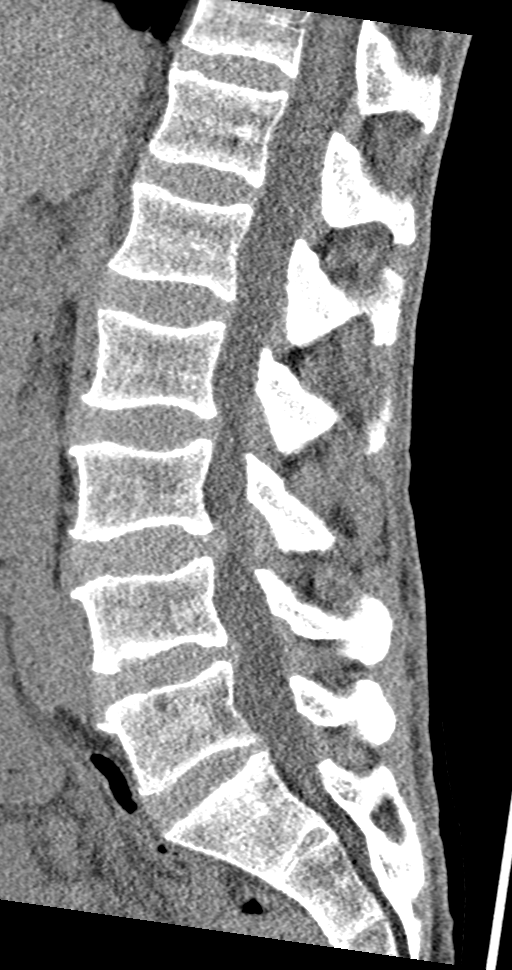
[im 41/82  bone]
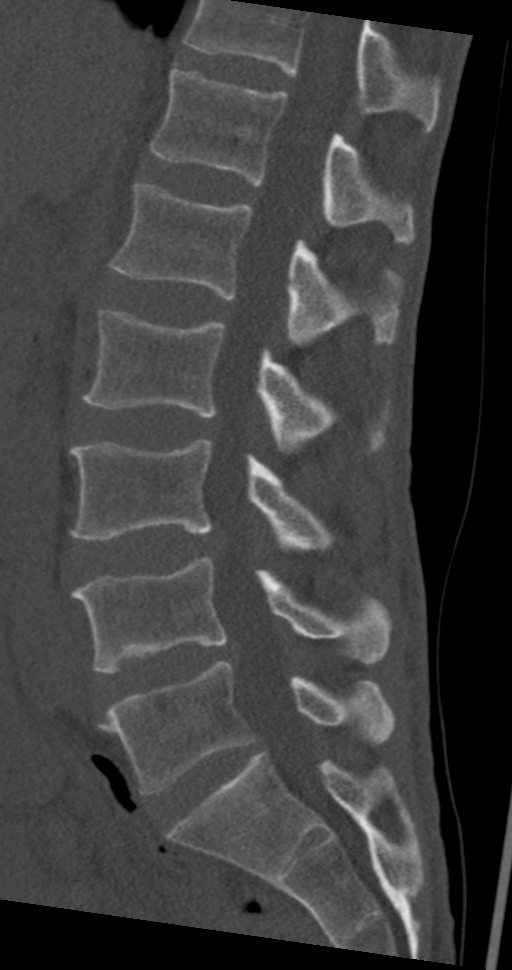
[im 48/82  bone]
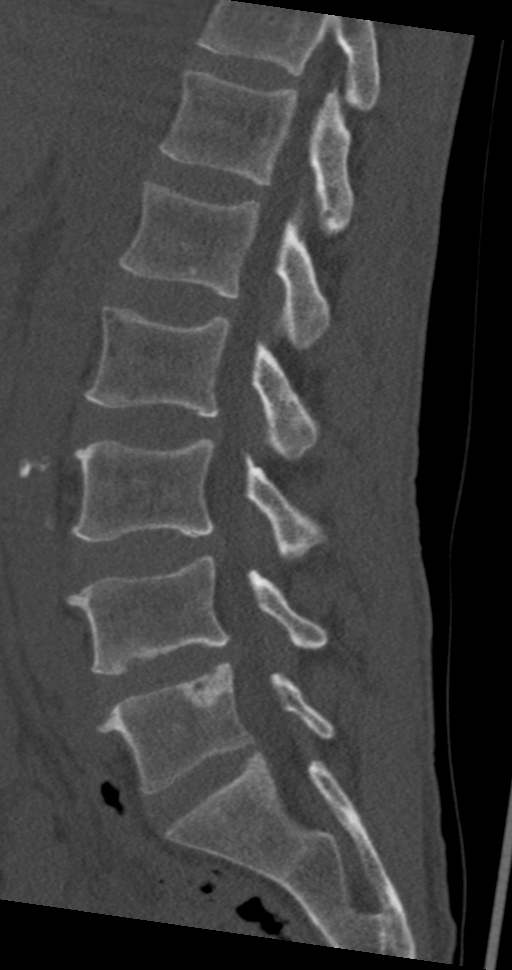
[im 55/82  bone]
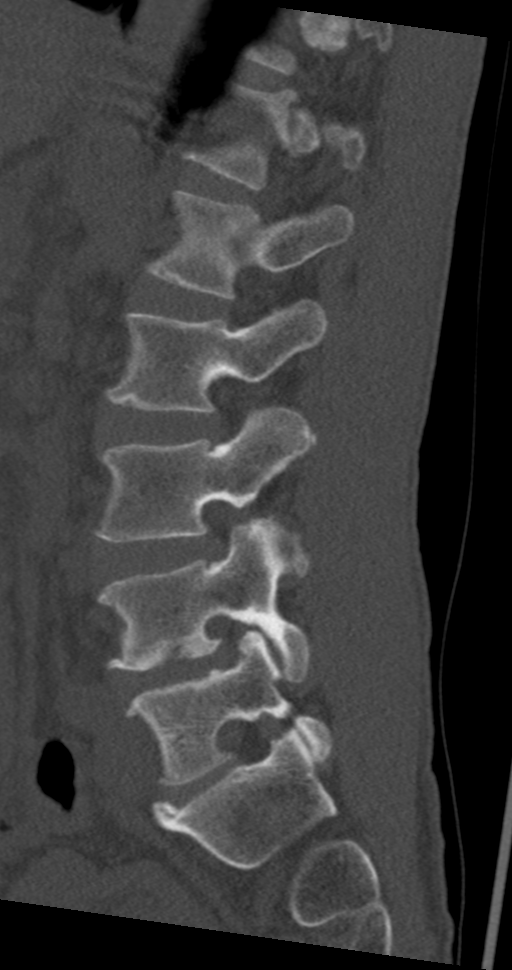

[10 of 33 positions shown; findings below may reference images not displayed]

FINDINGS: Segmentation: 5 lumbar type vertebrae.

Alignment: Preserved.

Vertebrae: Vertebral body heights are maintained. No acute fracture.

Paraspinal and other soft tissues: Unremarkable.

Disc levels:

L1-L2:  No stenosis.

L2-L3: Minimal disc bulge. Mild facet arthropathy and ligamentum
flavum thickening. No significant stenosis.

L3-L4: Disc bulge with shallow central disc protrusion. Mild facet
arthropathy with ligamentum flavum thickening. Mild to moderate
canal stenosis. Narrowing of the lateral recesses. Mild right
foraminal stenosis. No significant left foraminal stenosis.

L4-L5: Mild disc bulge with endplate osteophytic ridging. Mild facet
arthropathy with ligamentum flavum thickening. No significant canal
or right foraminal stenosis. Mild left foraminal stenosis.

L5-S1:  No stenosis.
IMPRESSION: Mild multilevel degenerative changes. No high-grade canal or
foraminal narrowing. Subarticular recess narrowing at L3-L4.

## 2022-05-03 NOTE — Progress Notes (Deleted)
   NEUROLOGY CONSULTATION NOTE  [place] MRN: [id number] DOB: [birth year]  Referring provider: Octaviano Glow, MD (ED referral) Primary care provider: ***  Reason for consult:  headache, dizziness  Assessment/Plan:   ***   Subjective:  Kevin Knapp is a 57 year old male who presents for dizziness and headache.  History supplemented by ED notes.  Since early 2023, he has ***.  He was originally seen in the ED on 03/16/2021 where CT head performed was unremarkable.  An MRI was recommended but patient declined and wanted to leave.  ***.  He returned to the ED on 12/08/2021.  Repeat CT head was again unremarkable.  MRI of brain without contrast revealed mild chronic small vessel ischemic changes in the cerebral white matter but also questionable asymmetric FLAIR hyperintensity involving the left transverse sinus.  Follow up MRV was normal, indicating previously noted signal abnormality was consistent with slow flow.  ***     PAST MEDICAL HISTORY: No past medical history on file.  PAST SURGICAL HISTORY: No past surgical history on file.  MEDICATIONS: Current Outpatient Medications on File Prior to Visit  Medication Sig Dispense Refill   amoxicillin-clavulanate (AUGMENTIN) 875-125 MG tablet Take 1 tablet by mouth every 12 (twelve) hours. 14 tablet 0   ibuprofen (ADVIL) 600 MG tablet Take 1 tablet (600 mg total) by mouth every 6 (six) hours as needed. 20 tablet 0   meclizine (ANTIVERT) 25 MG tablet Take 1 tablet (25 mg total) by mouth 3 (three) times daily as needed for up to 30 doses for dizziness. 30 tablet 2   No current facility-administered medications on file prior to visit.    ALLERGIES: No Known Allergies  FAMILY HISTORY: Family History  Problem Relation Age of Onset   Hypertension Mother     Objective:  *** General: No acute distress.  Patient appears well-groomed.   Head:  Normocephalic/atraumatic Eyes:  fundi examined but not visualized Neck: supple, no  paraspinal tenderness, full range of motion Back: No paraspinal tenderness Heart: regular rate and rhythm Lungs: Clear to auscultation bilaterally. Vascular: No carotid bruits. Neurological Exam: Mental status: alert and oriented to person, place, and time, speech fluent and not dysarthric, language intact. Cranial nerves: CN I: not tested CN II: pupils equal, round and reactive to light, visual fields intact CN III, IV, VI:  full range of motion, no nystagmus, no ptosis CN V: facial sensation intact. CN VII: upper and lower face symmetric CN VIII: hearing intact CN IX, X: gag intact, uvula midline CN XI: sternocleidomastoid and trapezius muscles intact CN XII: tongue midline Bulk & Tone: normal, no fasciculations. Motor:  muscle strength 5/5 throughout Sensation:  Pinprick, temperature and vibratory sensation intact. Deep Tendon Reflexes:  2+ throughout,  toes downgoing.   Finger to nose testing:  Without dysmetria.   Heel to shin:  Without dysmetria.   Gait:  Normal station and stride.  Romberg negative.    Thank you for allowing me to take part in the care of this patient.  Metta Clines, DO  CC: ***

## 2022-05-04 ENCOUNTER — Encounter: Payer: Self-pay | Admitting: Neurology

## 2022-05-04 ENCOUNTER — Ambulatory Visit: Payer: BC Managed Care – PPO | Admitting: Neurology

## 2023-09-16 ENCOUNTER — Ambulatory Visit (INDEPENDENT_AMBULATORY_CARE_PROVIDER_SITE_OTHER)

## 2023-09-16 ENCOUNTER — Ambulatory Visit
Admission: EM | Admit: 2023-09-16 | Discharge: 2023-09-16 | Disposition: A | Discharge status: Home / Self Care | Attending: Family Medicine | Admitting: Family Medicine

## 2023-09-16 ENCOUNTER — Ambulatory Visit: Payer: Self-pay | Admitting: Nurse Practitioner

## 2023-09-16 ENCOUNTER — Other Ambulatory Visit: Payer: Self-pay

## 2023-09-16 DIAGNOSIS — M533 Sacrococcygeal disorders, not elsewhere classified: Secondary | ICD-10-CM

## 2023-09-16 DIAGNOSIS — M25551 Pain in right hip: Secondary | ICD-10-CM

## 2023-09-16 DIAGNOSIS — M7071 Other bursitis of hip, right hip: Secondary | ICD-10-CM | POA: Diagnosis not present

## 2023-09-16 HISTORY — DX: Dizziness and giddiness: R42

## 2023-09-16 MED ORDER — NAPROXEN 375 MG PO TABS
375.0000 mg | ORAL_TABLET | Freq: Two times a day (BID) | ORAL | 0 refills | Status: DC | PRN
Start: 2023-09-17 — End: 2024-01-02

## 2023-09-16 MED ORDER — CYCLOBENZAPRINE HCL 10 MG PO TABS
10.0000 mg | ORAL_TABLET | Freq: Two times a day (BID) | ORAL | 0 refills | Status: DC | PRN
Start: 2023-09-16 — End: 2024-01-02

## 2023-09-16 MED ORDER — KETOROLAC TROMETHAMINE 30 MG/ML IJ SOLN
15.0000 mg | Freq: Once | INTRAMUSCULAR | Status: AC
Start: 2023-09-16 — End: 2023-09-16
  Administered 2023-09-16: 15 mg via INTRAMUSCULAR

## 2023-09-16 NOTE — ED Provider Notes (Signed)
 [facility]    CSN: [id number] Arrival date & time: 09/16/23  0805      History   Chief Complaint Chief Complaint  Patient presents with   Hip Pain         HPI Kevin Knapp is a 58 y.o. male with a past medical history of vertigo presents for hip and back pain.  Patient reports 3 weeks of a intermittent pinpoint right posterior hip pain as well as lateral/anterior right hip pain.  He denies any injury.  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  He denies any known specific event prior to onset of symptoms.  He states due to his posterior right hip pain he has been altering his gait which is now causing his anterior/lateral right hip to hurt and is beginning to radiate down into his leg.  He denies any numbness or weakness of the leg.  Does report intermittent paresthesias.  No bowel or bladder incontinence or saddle paresthesia.  Denies low back pain.  Denies history of injuries or surgeries to the back or the hip.  He has been taking Tylenol  arthritis and BC powder without improvement.  No other concerns at this time  HPI  Past Medical History:  Diagnosis Date   Vertigo     There are no active problems to display for this patient.   History reviewed. No pertinent surgical history.     Home Medications    Prior to Admission medications   Medication Sig Start Date End Date Taking? Authorizing Provider  cyclobenzaprine  (FLEXERIL ) 10 MG tablet Take 1 tablet (10 mg total) by mouth 2 (two) times daily as needed for muscle spasms. 09/16/23  Yes Jae Bruck, Jodi R, NP  naproxen  (NAPROSYN ) 375 MG tablet Take 1 tablet (375 mg total) by mouth 2 (two) times daily as needed (hip pain). 09/17/23  Yes Leam Madero, Jodi R, NP  meclizine  (ANTIVERT ) 25 MG tablet Take 1 tablet (25 mg total) by mouth 3 (three) times daily as needed for up to 30 doses for dizziness. 12/08/21   Cottie Donnice PARAS, MD    Family  History Family History  Problem Relation Age of Onset   Hypertension Mother     Social History Social History   Tobacco Use   Smoking status: Every Day    Current packs/day: 0.25    Types: Cigarettes   Smokeless tobacco: Never  Vaping Use   Vaping status: Never Used  Substance Use Topics   Alcohol use: Never   Drug use: Never     Allergies   Patient has no known allergies.   Review of Systems Review of Systems  Musculoskeletal:        Right hip pain      Physical Exam Triage Vital Signs ED Triage Vitals  Encounter Vitals Group     BP 09/16/23 0818 120/78     Girls Systolic BP Percentile --      Girls Diastolic BP Percentile --      Boys Systolic BP Percentile --      Boys Diastolic BP Percentile --      Pulse Rate 09/16/23 0818 71     Resp 09/16/23 0818 16     Temp 09/16/23 0818 98.1 F (36.7 C)     Temp Source 09/16/23 0818 Oral     SpO2 09/16/23 0818 98 %     Weight --  Height --      Head Circumference --      Peak Flow --      Pain Score 09/16/23 0816 9     Pain Loc --      Pain Education --      Exclude from Growth Chart --    No data found.  Updated Vital Signs BP 120/78   Pulse 71   Temp 98.1 F (36.7 C) (Oral)   Resp 16   SpO2 98%   Visual Acuity Right Eye Distance:   Left Eye Distance:   Bilateral Distance:    Right Eye Near:   Left Eye Near:    Bilateral Near:     Physical Exam Vitals and nursing note reviewed.  Constitutional:      General: He is not in acute distress.    Appearance: Normal appearance. He is not ill-appearing.  HENT:     Head: Normocephalic and atraumatic.  Eyes:     Pupils: Pupils are equal, round, and reactive to light.  Cardiovascular:     Rate and Rhythm: Normal rate.  Pulmonary:     Effort: Pulmonary effort is normal.  Musculoskeletal:     Lumbar back: Normal. No spasms, tenderness or bony tenderness. Negative right straight leg raise test and negative left straight leg raise test.        Legs:     Comments: There is no swelling, ecchymosis, erythema of the right hip.  There is pinpoint tenderness to the SI joint as well as to the anterior right hip.  Full range of motion of hip with pain on abduction.  Strength is 5 out of 5 bilateral lower extremities.  Skin:    General: Skin is warm and dry.  Neurological:     General: No focal deficit present.     Mental Status: He is alert and oriented to person, place, and time.     Deep Tendon Reflexes:     Reflex Scores:      Patellar reflexes are 2+ on the right side and 2+ on the left side. Psychiatric:        Mood and Affect: Mood normal.        Behavior: Behavior normal.      UC Treatments / Results  Labs (all labs ordered are listed, but only abnormal results are displayed) Labs Reviewed - No data to display  EKG   Radiology DG Hip Unilat With Pelvis 2-3 Views Right Result Date: 09/16/2023 CLINICAL DATA:  Right hip pain and sciatica. EXAM: DG HIP (WITH OR WITHOUT PELVIS) 2-3V RIGHT COMPARISON:  None Available. FINDINGS: There is no evidence of hip fracture or dislocation. There is no evidence of arthropathy or other focal bone abnormality. IMPRESSION: Negative. Electronically Signed   By: Camellia Candle M.D.   On: 09/16/2023 08:57    Procedures Procedures (including critical care time)  Medications Ordered in UC Medications  ketorolac  (TORADOL ) 30 MG/ML injection 15 mg (15 mg Intramuscular Given 09/16/23 0838)    Initial Impression / Assessment and Plan / UC Course  I have reviewed the triage vital signs and the nursing notes.  Pertinent labs & imaging results that were available during my care of the patient were reviewed by me and considered in my medical decision making (see chart for details).     Reviewed exam and symptoms with patient.  No red flags.  Patient given Toradol  injection in clinic.  He was monitored for 10 minutes after injection with no reaction  noted and tolerated well.  He reports improvement  in symptoms.  He was instructed no NSAIDs for 24 hours and verbalized understanding.  X-ray negative for any abnormalities.  Based on exam discussed likely hip bursitis/SI joint pain.  Will do trial of Flexeril , side effect profile reviewed.  He will start Rx naproxen  tomorrow, 8/10.  Advised PCP follow-up if symptoms do not improve.  ER precautions reviewed. Final Clinical Impressions(s) / UC Diagnoses   Final diagnoses:  Right hip pain  Bursitis of right hip, unspecified bursa  Sacroiliac joint pain     Discharge Instructions      You were given a Toradol  injection in clinic today. Do not take any over the counter NSAID's such as Advil , ibuprofen , Aleve , or naproxen  for 24 hours. You may take tylenol  if needed.  May take twice daily as needed.  Please note this medication will make you drowsy.  Do not drink alcohol or drive while taking this medication.  Start naproxen  twice daily as needed tomorrow, 8/10.  You may do heat to the hip as needed.  Rest as much as you can.  Please follow-up with your PCP if your symptoms do not improve.  Please go to the ER if you develop any worsening symptoms.  Frees you feel better soon!      ED Prescriptions     Medication Sig Dispense Auth. Provider   naproxen  (NAPROSYN ) 375 MG tablet Take 1 tablet (375 mg total) by mouth 2 (two) times daily as needed (hip pain). 14 tablet Ineze Serrao, Jodi R, NP   cyclobenzaprine  (FLEXERIL ) 10 MG tablet Take 1 tablet (10 mg total) by mouth 2 (two) times daily as needed for muscle spasms. 10 tablet Vedha Tercero, Jodi R, NP      PDMP not reviewed this encounter.   Loreda Myla SAUNDERS, NP 09/16/23 [phone]

## 2023-09-16 NOTE — Discharge Instructions (Addendum)
 You were given a Toradol  injection in clinic today. Do not take any over the counter NSAID's such as Advil , ibuprofen , Aleve , or naproxen  for 24 hours. You may take tylenol  if needed.  May take twice daily as needed.  Please note this medication will make you drowsy.  Do not drink alcohol or drive while taking this medication.  Start naproxen  twice daily as needed tomorrow, 8/10.  You may do heat to the hip as needed.  Rest as much as you can.  Please follow-up with your PCP if your symptoms do not improve.  Please go to the ER if you develop any worsening symptoms.  Hedman you feel better soon!

## 2023-09-16 NOTE — ED Triage Notes (Addendum)
 Pt c/o right hip pain that radiates down right [id number]. Pt states the right leg feels numb and tingling. Pt states he does lift heavy objects for work. PT states has used arthritic meds and BC powder and it hasn't helped

## 2023-10-28 ENCOUNTER — Ambulatory Visit
Admission: EM | Admit: 2023-10-28 | Discharge: 2023-10-28 | Disposition: A | Discharge status: Other Acute Inpatient Hospital | Attending: Family Medicine | Admitting: Family Medicine

## 2023-10-28 ENCOUNTER — Other Ambulatory Visit: Payer: Self-pay

## 2023-10-28 DIAGNOSIS — M7989 Other specified soft tissue disorders: Secondary | ICD-10-CM

## 2023-10-28 NOTE — Discharge Instructions (Addendum)
Please go to the emergency room for further evaluation of your leg swelling

## 2023-10-28 NOTE — ED Notes (Signed)
 Patient is being discharged from the Urgent Care and sent to the Emergency Department via POV . Per Myla Bold, NP, patient is in need of higher level of care due to needing further testing. Patient is aware and verbalizes understanding of plan of care.  Vitals:   10/28/23 0847  BP: (!) 143/85  Pulse: 70  Resp: 17  Temp: 97.8 F (36.6 C)  SpO2: 96%

## 2023-10-28 NOTE — ED Provider Notes (Signed)
 [facility]    CSN: [id number] Arrival date & time: 10/28/23  0836      History   Chief Complaint Chief Complaint  Patient presents with   Leg Swelling    HPI Kevin Knapp is a 58 y.o. male presents for leg swelling.  Patient reports 6 days ago he developed right lower leg swelling that extended to his calf.  States he had some redness and warmth on the anterior aspects of the lower leg that migrated into his posterior calf.  He states that is since resolved.  He does endorse some tingling in the area but no numbness.  No injury.  Swelling does somewhat improve with elevation.  He does do a lot of standing for work as well.  He denies any chest pain, shortness of breath, orthopnea.  No long distance travel.  No history of DVT or PE.  He states he was on prednisone  recently for hip pain.  He is concerned for possible blood clot.  No other concerns at this time  HPI  Past Medical History:  Diagnosis Date   Vertigo     There are no active problems to display for this patient.   History reviewed. No pertinent surgical history.     Home Medications    Prior to Admission medications   Medication Sig Start Date End Date Taking? Authorizing Provider  cyclobenzaprine  (FLEXERIL ) 10 MG tablet Take 1 tablet (10 mg total) by mouth 2 (two) times daily as needed for muscle spasms. 09/16/23   Raseel Jans, Jodi R, NP  meclizine  (ANTIVERT ) 25 MG tablet Take 1 tablet (25 mg total) by mouth 3 (three) times daily as needed for up to 30 doses for dizziness. 12/08/21   Cottie Donnice PARAS, MD  naproxen  (NAPROSYN ) 375 MG tablet Take 1 tablet (375 mg total) by mouth 2 (two) times daily as needed (hip pain). 09/17/23   Loreda Myla SAUNDERS, NP    Family History Family History  Problem Relation Age of Onset   Hypertension Mother     Social History Social History   Tobacco Use   Smoking status: Every Day    Current packs/day: 0.25    Types: Cigarettes   Smokeless tobacco: Never  Vaping Use    Vaping status: Never Used  Substance Use Topics   Alcohol use: Never   Drug use: Never     Allergies   Patient has no known allergies.   Review of Systems Review of Systems  Skin:        Right lower leg swelling     Physical Exam Triage Vital Signs ED Triage Vitals  Encounter Vitals Group     BP 10/28/23 0847 (!) 143/85     Girls Systolic BP Percentile --      Girls Diastolic BP Percentile --      Boys Systolic BP Percentile --      Boys Diastolic BP Percentile --      Pulse Rate 10/28/23 0847 70     Resp 10/28/23 0847 17     Temp 10/28/23 0847 97.8 F (36.6 C)     Temp Source 10/28/23 0847 Oral     SpO2 10/28/23 0847 96 %     Weight --      Height --      Head Circumference --      Peak Flow --      Pain Score 10/28/23 0845 0     Pain Loc --  Pain Education --      Exclude from Growth Chart --    No data found.  Updated Vital Signs BP (!) 143/85   Pulse 70   Temp 97.8 F (36.6 C) (Oral)   Resp 17   SpO2 96%   Visual Acuity Right Eye Distance:   Left Eye Distance:   Bilateral Distance:    Right Eye Near:   Left Eye Near:    Bilateral Near:     Physical Exam Vitals and nursing note reviewed.  Constitutional:      General: He is not in acute distress.    Appearance: Normal appearance. He is not ill-appearing.  HENT:     Head: Normocephalic and atraumatic.  Eyes:     Pupils: Pupils are equal, round, and reactive to light.  Cardiovascular:     Rate and Rhythm: Normal rate.  Pulmonary:     Effort: Pulmonary effort is normal.  Musculoskeletal:     Right lower leg: Swelling and tenderness present. 2+ Pitting Edema present.     Left lower leg: Normal.     Comments: Right calf measures 36 cm, left measures 35.5 cm.  There is no tenderness along the posterior calf but there is tenderness with +1-2 pitting edema to the anterior lower leg and ankle.  No redness or warmth.  DP +1.  Skin:    General: Skin is warm and dry.  Neurological:      General: No focal deficit present.     Mental Status: He is alert and oriented to person, place, and time.  Psychiatric:        Mood and Affect: Mood normal.        Behavior: Behavior normal.    Clinical feature Score     Active cancer (treatment ongoing or within the previous six months or palliative) 0  Paralysis, paresis, or recent plaster immobilization of the lower extremities 0  Recently bedridden for more than three days or major surgery, within four weeks                                                    0  Localized tenderness along the distribution of the deep venous system 0  Entire leg swollen 0  Calf swelling by more than 3 cm when compared to the asymptomatic leg (measured below tibial tuberosity) 0  Pitting edema (greater in the symptomatic leg) 1  Collateral superficial veins (nonvaricose) 0  Alternative diagnosis as likely or more likely than that of deep venous thrombosis 0  Total Score 1     Interpretation    High probability  3 or greater  Moderate probability 1 or 2  Low probability 0 or less  Modification   This clinical model has been modified to take one other clinical feature into account: a previously documented deep vein thrombosis (DVT) is given the score of 1. Using this modified scoring system, DVT is either likely or unlikely, as follows:  DVT likely 2 or greater   DVT unlikely 1 or less     UC Treatments / Results  Labs (all labs ordered are listed, but only abnormal results are displayed) Labs Reviewed - No data to display  EKG   Radiology No results found.  Procedures Procedures (including critical care time)  Medications Ordered in UC Medications - No data to display  Initial Impression / Assessment and Plan / UC Course  I have reviewed the triage vital signs and the nursing  notes.  Pertinent labs & imaging results that were available during my care of the patient were reviewed by me and considered in my medical decision making (see chart for details).     I reviewed exam and symptoms with patient.  Discussed limitations and abilities of urgent care.  Low suspicion for DVT but patient is concerned and advised I am unable to rule this out in this setting.  Given his concern I advised to go to the emergency room for further evaluation.  He is in agreement with plan will go POV to the emergency room. Final Clinical Impressions(s) / UC Diagnoses   Final diagnoses:  Right leg swelling     Discharge Instructions      Please go to the emergency room for further evaluation of your leg swelling.    ED Prescriptions   None    PDMP not reviewed this encounter.   Loreda Myla SAUNDERS, NP 10/28/23 [phone]

## 2023-10-28 NOTE — ED Triage Notes (Addendum)
 Pt c/o RLE swellingx6d ago. Pt states it's not as swollen as it was. Pt has 1+ swelling of right ankle and foot. Pt states he is on his feet all day for his job. PT states the swelling has been worse at the job, but got better from not wearing a sock and elevating it

## 2024-01-02 ENCOUNTER — Ambulatory Visit: Payer: Self-pay | Admitting: Family Medicine

## 2024-01-02 ENCOUNTER — Encounter: Payer: Self-pay | Admitting: Family Medicine

## 2024-01-02 VITALS — BP 138/88 | HR 72 | Ht 79.0 in | Wt 177.2 lb

## 2024-01-02 DIAGNOSIS — M545 Low back pain, unspecified: Secondary | ICD-10-CM | POA: Diagnosis not present

## 2024-01-02 DIAGNOSIS — M5431 Sciatica, right side: Secondary | ICD-10-CM | POA: Diagnosis not present

## 2024-01-02 DIAGNOSIS — K047 Periapical abscess without sinus: Secondary | ICD-10-CM | POA: Diagnosis not present

## 2024-01-02 MED ORDER — AMOXICILLIN-POT CLAVULANATE 875-125 MG PO TABS: 1.0000 | ORAL_TABLET | Freq: Two times a day (BID) | ORAL | 0 refills | Status: AC

## 2024-01-02 MED ORDER — METHYLPREDNISOLONE 4 MG PO TBPK: ORAL_TABLET | ORAL | 0 refills | Status: DC

## 2024-01-02 NOTE — Progress Notes (Signed)
 Name: Kevin Knapp [facility]   Date of Visit: 01/02/24   Date of last visit with me: Visit date not found   CHIEF COMPLAINT:  Chief Complaint  Patient presents with   Establish Care    New patient to establish care. He has an abscess in his mouth and cannot get to the dentist. Asking for abx with the holiday. Has a form he need signed for plasma donation. Has nerve damage in right leg and was about to have MRI but can no afford specialty copay or cost of MRI.        HPI:  Discussed the use of AI scribe software for clinical note transcription with the patient, who gave verbal consent to proceed.  History of Present Illness   Kevin Knapp is a 58 year old male who presents with mouth pain due to a dental abscess and back pain.  He has been experiencing mouth pain due to a dental abscess for about one month, which has led to a sinus infection. The pain in the mouth started over the weekend and has been persistent since then. He has not been able to see a doctor to have the tooth extracted due to the infection.  He reports significant back pain, described as excruciating and located in the lower back, radiating downwards. The pain feels like 'things are crawling' and is associated with a sensation of tightness and burning. The pain does not worsen with touch. He was previously prescribed gabapentin, which he took for almost two months without relief, and a steroid, which was initially given alongside gabapentin. The gabapentin dosage was increased, but he stopped taking it due to drowsiness and lack of efficacy. He has not been able to proceed with an MRI due to insurance issues. No groin pain is associated with his back pain.  He has not donated plasma in six to eight months. He discusses his past reluctance to seek regular medical care, despite encouragement from his ex-wife, ex-girlfriend, and sponsor. He wants to be more proactive about his health as he approaches his 59th birthday.          OBJECTIVE:       01/02/2024   10:02 AM  Depression screen PHQ 2/9  Decreased Interest 0  Down, Depressed, Hopeless 0  PHQ - 2 Score 0     BP Readings from Last 3 Encounters:  01/02/24 138/88  10/28/23 (!) 143/85  09/16/23 120/78    BP 138/88   Pulse 72   Ht 6' 7 (2.007 m)   Wt 177 lb 3.2 oz (80.4 kg)   BMI 19.96 kg/m    Physical Exam   HEENT: Oral cavity with abscess and poor dental hygiene. Swellling and tenderness upper right gum near molars. MUSCULOSKELETAL: Positive straight leg test. Decreased sensation of the right lower extremity.      Physical Exam  ASSESSMENT/PLAN:   Assessment & Plan Dental abscess  Lumbar back pain  Sciatica of right side    Assessment and Plan    Periapical abscess of tooth  - Prescribed antibiotics for one week. - Likely 2/2 to poor dental hygiene. Discussed the need to see the dentist for possible teeth pulling.  Patient is understanding and agreeable.  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 Current flare-up requires acute management. -Given patient's persistent chronic sciatica, we will go ahead and prescribe a Medrol  Dosepak which patient is recommended to start after the antibiotic finishes.  I do believe  he would benefit from gabapentin and restarting in the near future. - Prescribed steroid post-antibiotic course. - Plan to pursue MRI approval after insurance renewal next year.         Aaronjames Kelsay A. Vita MD [facility]

## 2024-02-23 ENCOUNTER — Ambulatory Visit: Admitting: Family Medicine

## 2024-02-23 ENCOUNTER — Encounter: Payer: Self-pay | Admitting: Family Medicine

## 2024-02-23 VITALS — BP 122/86 | HR 64 | Ht 79.0 in | Wt 176.2 lb

## 2024-02-23 DIAGNOSIS — Z720 Tobacco use: Secondary | ICD-10-CM | POA: Diagnosis not present

## 2024-02-23 DIAGNOSIS — R634 Abnormal weight loss: Secondary | ICD-10-CM

## 2024-02-23 DIAGNOSIS — Z0000 Encounter for general adult medical examination without abnormal findings: Secondary | ICD-10-CM | POA: Diagnosis not present

## 2024-02-23 DIAGNOSIS — Z136 Encounter for screening for cardiovascular disorders: Secondary | ICD-10-CM

## 2024-02-23 DIAGNOSIS — M545 Low back pain, unspecified: Secondary | ICD-10-CM

## 2024-02-23 DIAGNOSIS — Z1211 Encounter for screening for malignant neoplasm of colon: Secondary | ICD-10-CM

## 2024-02-23 DIAGNOSIS — M5431 Sciatica, right side: Secondary | ICD-10-CM | POA: Diagnosis not present

## 2024-02-23 LAB — LIPID PANEL

## 2024-02-23 NOTE — Patient Instructions (Signed)
 Please go get your xrays done at [facility]. You do not need to make an appointment. You can just show up.   Address: [address] [place], [place], [place] [address]

## 2024-02-23 NOTE — Progress Notes (Signed)
 "  Name: Demonta Wombles [facility]   Date of Visit: 02/23/24   Date of last visit with me: 01/02/2024   CHIEF COMPLAINT:  Chief Complaint  Patient presents with   Annual Exam    Cpe.         HPI:  Discussed the use of AI scribe software for clinical note transcription with the patient, who gave verbal consent to proceed.  History of Present Illness   Kevin Knapp is a 59 year old male who presents for an annual physical exam.  He has a history of back pain that was previously treated with steroids and has since resolved. He experiences morning stiffness, which he attributes to age, and reports that his previous back pain is gone.  He smokes approximately 8 to 10 cigarettes per day. He has not expressed a strong interest in quitting at this time.  He mentions weight fluctuations, which he attributes to his dietary habits. He often skips breakfast due to his early work schedule, eats a light lunch, and typically picks up something to eat on his way home. 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  No issues with urination, such as starting or stopping, are reported.  He has never had a colonoscopy before.         OBJECTIVE:       02/23/2024    8:58 AM  Depression screen PHQ 2/9  Decreased Interest 0  Down, Depressed, Hopeless 0  PHQ - 2 Score 0     BP Readings from Last 3 Encounters:  02/23/24 122/86  01/02/24 138/88  10/28/23 (!) 143/85    BP 122/86   Pulse 64   Ht 6' 7 (2.007 m)   Wt 176 lb 3.2 oz (79.9 kg)   SpO2 98%   BMI 19.85 kg/m    Physical Exam          Physical Exam Constitutional:      Appearance: Normal appearance.  Neurological:     General: No focal deficit present.     Mental Status: He is alert and oriented to person, place, and time. Mental status is at baseline.     ASSESSMENT/PLAN:   Assessment & Plan Lumbar back pain  Sciatica of right side  Annual  physical exam  Tobacco abuse  Encounter for screening for cardiovascular disorders  Encounter for screening colonoscopy  Weight loss    Assessment and Plan    Lumbar back pain Resolved with steroid treatment. Morning stiffness persists. - Ordered x-ray of the back at [facility] for future reference if pain recurs.  Tobacco use Continues smoking half a pack per day. No interest in quitting. - Offered support for smoking cessation if he decides to quit in the future.  Screening for cardiovascular disorders Screening necessary due to smoking history. Blood pressure well-controlled. - Ordered blood work to assess cholesterol levels and other cardiovascular risk factors.  Screening for colon cancer Due for colonoscopy. Higher risk due to being African American. - Scheduled colonoscopy.  Abnormal weight loss Weight fluctuates, possibly due to irregular eating habits and physically demanding job.     Annual Physical - Colonoscopy referral, hepatitis C and HIV screening -Comprehensive annual physical exam completed today. Reviewed interval history, current medical issues, medications, allergies, and preventive care needs. Addressed all patient questions and concerns. Discussed lifestyle factors including diet, exercise, sleep, and stress management. Reviewed recommended age-appropriate screenings, labs, and vaccinations. Counseling  provided on healthy habits and routine health maintenance. Follow-up as indicated based on findings and results.   Smoking/Tobacco Cessation Counseling [facility] is a current user of tobacco or nicotine products. He is not ready to quit at this time. Counseling provided today addressed the risks of continued use and the benefits of cessation. Discussed tobacco/nicotine use history, readiness to quit, and evidence-based treatment options including behavioral strategies, support resources, and pharmacologic therapies. Provided encouragement and  educational materials on steps and resources to quit smoking. Patient questions were addressed, and follow-up recommended for continued support. Total time spent on counseling: 4 minutes.      Alcee Sipos A. Vita MD [facility] "

## 2024-02-24 LAB — LIPID PANEL
Chol/HDL Ratio: 2.9 ratio (ref 0.0–5.0)
Cholesterol, Total: 155 mg/dL (ref 100–199)
HDL: 54 mg/dL
LDL Chol Calc (NIH): 91 mg/dL (ref 0–99)
Triglycerides: 49 mg/dL (ref 0–149)
VLDL Cholesterol Cal: 10 mg/dL (ref 5–40)

## 2024-02-24 LAB — COMPREHENSIVE METABOLIC PANEL WITH GFR
ALT: 8 IU/L (ref 0–44)
AST: 13 IU/L (ref 0–40)
Albumin: 4.4 g/dL (ref 3.8–4.9)
Alkaline Phosphatase: 65 IU/L (ref 47–123)
BUN/Creatinine Ratio: 16 (ref 10–24)
BUN: 19 mg/dL (ref 8–27)
Bilirubin Total: 0.8 mg/dL (ref 0.0–1.2)
CO2: 22 mmol/L (ref 20–29)
Calcium: 9.1 mg/dL (ref 8.6–10.2)
Chloride: 105 mmol/L (ref 96–106)
Creatinine, Ser: 1.17 mg/dL (ref 0.76–1.27)
Globulin, Total: 2.4 g/dL (ref 1.5–4.5)
Glucose: 84 mg/dL (ref 70–99)
Potassium: 4.3 mmol/L (ref 3.5–5.2)
Sodium: 142 mmol/L (ref 134–144)
Total Protein: 6.8 g/dL (ref 6.0–8.5)
eGFR: 71 mL/min/1.73

## 2024-02-24 LAB — HIV ANTIBODY (ROUTINE TESTING W REFLEX): HIV Screen 4th Generation wRfx: NONREACTIVE

## 2024-02-24 LAB — CBC WITH DIFFERENTIAL/PLATELET
Basophils Absolute: 0 x10E3/uL (ref 0.0–0.2)
Basos: 1 %
EOS (ABSOLUTE): 0.1 x10E3/uL (ref 0.0–0.4)
Eos: 1 %
Hematocrit: 38.2 % (ref 37.5–51.0)
Hemoglobin: 13.2 g/dL (ref 13.0–17.7)
Immature Grans (Abs): 0 x10E3/uL (ref 0.0–0.1)
Immature Granulocytes: 0 %
Lymphocytes Absolute: 1.2 x10E3/uL (ref 0.7–3.1)
Lymphs: 14 %
MCH: 32.8 pg (ref 26.6–33.0)
MCHC: 34.6 g/dL (ref 31.5–35.7)
MCV: 95 fL (ref 79–97)
Monocytes Absolute: 0.4 x10E3/uL (ref 0.1–0.9)
Monocytes: 5 %
Neutrophils Absolute: 7 x10E3/uL (ref 1.4–7.0)
Neutrophils: 79 %
Platelets: 227 x10E3/uL (ref 150–450)
RBC: 4.02 x10E6/uL — ABNORMAL LOW (ref 4.14–5.80)
RDW: 12.2 % (ref 11.6–15.4)
WBC: 8.8 x10E3/uL (ref 3.4–10.8)

## 2024-02-24 LAB — HEPATITIS C ANTIBODY: Hep C Virus Ab: NONREACTIVE

## 2024-02-26 ENCOUNTER — Ambulatory Visit: Payer: Self-pay | Admitting: Family Medicine

## 2024-08-23 ENCOUNTER — Ambulatory Visit: Admitting: Family Medicine

## 2025-02-28 ENCOUNTER — Encounter: Admitting: Family Medicine
